# Patient Record
Sex: Male | Born: 1970 | Race: Black or African American | Hispanic: No | Marital: Single | Smoking: Current some day smoker
Health system: Southern US, Community
[De-identification: ages and names within clinical notes are randomized; demographics above are authoritative.]

## PROBLEM LIST (undated history)

## (undated) DIAGNOSIS — R269 Unspecified abnormalities of gait and mobility: Secondary | ICD-10-CM

## (undated) DIAGNOSIS — A530 Latent syphilis, unspecified as early or late: Secondary | ICD-10-CM

## (undated) DIAGNOSIS — G35 Multiple sclerosis: Secondary | ICD-10-CM

## (undated) DIAGNOSIS — M25559 Pain in unspecified hip: Secondary | ICD-10-CM

## (undated) DIAGNOSIS — C189 Malignant neoplasm of colon, unspecified: Secondary | ICD-10-CM

## (undated) DIAGNOSIS — G959 Disease of spinal cord, unspecified: Secondary | ICD-10-CM

## (undated) HISTORY — DX: Disease of spinal cord, unspecified: G95.9

## (undated) HISTORY — PX: CHOLECYSTECTOMY: SHX55

## (undated) HISTORY — DX: Latent syphilis, unspecified as early or late: A53.0

## (undated) HISTORY — DX: Malignant neoplasm of colon, unspecified: C18.9

## (undated) HISTORY — DX: Multiple sclerosis: G35

## (undated) HISTORY — PX: OTHER SURGICAL HISTORY: SHX169

## (undated) HISTORY — PX: HIP SURGERY: SHX245

## (undated) HISTORY — DX: Pain in unspecified hip: M25.559

## (undated) HISTORY — DX: Unspecified abnormalities of gait and mobility: R26.9

---

## 2008-05-02 ENCOUNTER — Encounter: Admission: RE | Admit: 2008-05-02 | Discharge: 2008-05-02 | Payer: Self-pay | Admitting: Family Medicine

## 2008-05-13 ENCOUNTER — Encounter: Admission: RE | Admit: 2008-05-13 | Discharge: 2008-05-13 | Payer: Self-pay | Admitting: Orthopedic Surgery

## 2008-06-03 ENCOUNTER — Emergency Department (HOSPITAL_COMMUNITY): Admission: EM | Admit: 2008-06-03 | Discharge: 2008-06-03 | Payer: Self-pay | Admitting: Emergency Medicine

## 2008-07-05 ENCOUNTER — Ambulatory Visit (HOSPITAL_BASED_OUTPATIENT_CLINIC_OR_DEPARTMENT_OTHER): Admission: RE | Admit: 2008-07-05 | Discharge: 2008-07-05 | Payer: Self-pay | Admitting: Orthopedic Surgery

## 2008-10-26 ENCOUNTER — Encounter: Admission: RE | Admit: 2008-10-26 | Discharge: 2008-10-26 | Payer: Self-pay | Admitting: Neurology
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
## 2011-07-18 ENCOUNTER — Other Ambulatory Visit: Payer: Self-pay | Admitting: Neurology

## 2011-07-18 DIAGNOSIS — G35 Multiple sclerosis: Secondary | ICD-10-CM

## 2011-07-26 ENCOUNTER — Ambulatory Visit
Admission: RE | Admit: 2011-07-26 | Discharge: 2011-07-26 | Disposition: A | Discharge status: Home / Self Care | Payer: Medicare Other | Source: Ambulatory Visit | Attending: Neurology | Admitting: Neurology

## 2011-07-26 DIAGNOSIS — G35 Multiple sclerosis: Secondary | ICD-10-CM

## 2011-07-26 MED ORDER — GADOBENATE DIMEGLUMINE 529 MG/ML IV SOLN
20.0000 mL | Freq: Once | INTRAVENOUS | Status: AC | PRN
  Administered 2011-07-26: 20 mL via INTRAVENOUS

## 2011-09-26 LAB — POCT HEMOGLOBIN-HEMACUE: Hemoglobin: 16.7

## 2012-12-07 ENCOUNTER — Other Ambulatory Visit: Payer: Self-pay | Admitting: Neurology

## 2012-12-07 DIAGNOSIS — G35 Multiple sclerosis: Secondary | ICD-10-CM

## 2012-12-18 ENCOUNTER — Ambulatory Visit
Admission: RE | Admit: 2012-12-18 | Discharge: 2012-12-18 | Disposition: A | Discharge status: Home / Self Care | Payer: Medicare Other | Source: Ambulatory Visit | Attending: Neurology | Admitting: Neurology

## 2012-12-18 DIAGNOSIS — G35D Multiple sclerosis, unspecified: Secondary | ICD-10-CM

## 2012-12-18 DIAGNOSIS — G35 Multiple sclerosis: Secondary | ICD-10-CM

## 2012-12-18 MED ORDER — GADOBENATE DIMEGLUMINE 529 MG/ML IV SOLN
20.0000 mL | Freq: Once | INTRAVENOUS | Status: AC | PRN
  Administered 2012-12-18: 20 mL via INTRAVENOUS

## 2013-04-16 ENCOUNTER — Ambulatory Visit (INDEPENDENT_AMBULATORY_CARE_PROVIDER_SITE_OTHER): Payer: Medicare Other | Admitting: Neurology

## 2013-04-16 ENCOUNTER — Encounter: Payer: Self-pay | Admitting: Neurology

## 2013-04-16 VITALS — BP 120/77 | HR 83 | Temp 98.6°F | Ht 73.5 in | Wt 244.0 lb

## 2013-04-16 DIAGNOSIS — G35 Multiple sclerosis: Secondary | ICD-10-CM | POA: Insufficient documentation

## 2013-04-16 HISTORY — DX: Multiple sclerosis: G35

## 2013-04-16 MED ORDER — BACLOFEN 10 MG PO TABS: 10.0000 mg | ORAL_TABLET | Freq: Three times a day (TID) | ORAL | Status: DC

## 2013-04-16 NOTE — Patient Instructions (Addendum)
I think overall you are doing fairly well but I do want to suggest a few things today:  Remember to drink plenty of fluid, eat healthy meals and do not skip any meals. Try to eat protein with a every meal and eat a healthy snack such as fruit or nuts in between meals. Try to keep a regular sleep-wake schedule and try to exercise daily, particularly in the form of walking, 20-30 minutes a day, if you can.   Engage in social activities in your community and with your family and try to keep up with current events by reading the newspaper or watching the news.   As far as your medications are concerned, I would like to suggest no change at this time. I will arrange for a FU with one of my colleagues, who are more experts in MS management.    Please call us with any interim questions, concerns, problems, updates or refill requests.  Brett Canales is my clinical assistant and will answer any of your questions and relay your messages to me and also relay most of my messages to you.  Our phone number is [phone]. We also have an after hours call service for urgent matters and there is a physician on-call for urgent questions. For any emergencies you know to call 911 or go to the nearest emergency room.
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
## 2013-04-17 LAB — COMPREHENSIVE METABOLIC PANEL
AST: 33 IU/L (ref 0–40)
Albumin: 4.6 g/dL (ref 3.5–5.5)
Alkaline Phosphatase: 110 IU/L (ref 39–117)
BUN: 14 mg/dL (ref 6–24)
CO2: 28 mmol/L (ref 19–28)
Calcium: 10 mg/dL (ref 8.7–10.2)
GFR calc non Af Amer: 83 mL/min/{1.73_m2} (ref >59)
Globulin, Total: 2.8 g/dL (ref 1.5–4.5)
Glucose: 82 mg/dL (ref 65–99)
Total Bilirubin: 0.3 mg/dL (ref 0.0–1.2)
Total Protein: 7.4 g/dL (ref 6.0–8.5)

## 2013-04-17 LAB — CBC
HCT: 45.1 % (ref 37.5–51.0)
Hemoglobin: 15.3 g/dL (ref 12.6–17.7)
MCHC: 33.9 g/dL (ref 31.5–35.7)
Platelets: 222 10*3/uL (ref 155–379)
RDW: 13.1 % (ref 12.3–15.4)

## 2013-04-19 NOTE — Progress Notes (Signed)
Quick Note:  Please call and advise the patient that the recent labs we checked were within normal limits. We checked cell count, electrolytes, kidney function, liver function. Please remind patient to keep any upcoming appointments and call with any interim questions, concerns, problems or updates. He has an appointment with Dr. Terrace Arabia on 10/08/2013 at 2:15 PM, Thanks,  Huston Foley, MD, PhD   ______

## 2013-04-23 NOTE — Progress Notes (Signed)
Quick Note:  Spoke with patient and relayed results of blood work. Patient understood and had no questions.  ______ 

## 2013-06-02 ENCOUNTER — Other Ambulatory Visit: Payer: Self-pay

## 2013-06-02 MED ORDER — INTERFERON BETA-1A 30 MCG/0.5ML IM KIT: 30.0000 ug | PACK | INTRAMUSCULAR | Status: DC

## 2013-10-08 ENCOUNTER — Encounter: Payer: Self-pay | Admitting: Neurology

## 2013-10-08 ENCOUNTER — Encounter (INDEPENDENT_AMBULATORY_CARE_PROVIDER_SITE_OTHER): Payer: Self-pay

## 2013-10-08 ENCOUNTER — Ambulatory Visit (INDEPENDENT_AMBULATORY_CARE_PROVIDER_SITE_OTHER): Payer: Medicare Other | Admitting: Neurology

## 2013-10-08 VITALS — BP 128/85 | HR 94 | Ht 74.0 in | Wt 244.0 lb

## 2013-10-08 DIAGNOSIS — G35 Multiple sclerosis: Secondary | ICD-10-CM

## 2013-10-08 MED ORDER — BACLOFEN 10 MG PO TABS: 10.0000 mg | ORAL_TABLET | Freq: Three times a day (TID) | ORAL | Status: DC
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
## 2013-10-08 NOTE — Patient Instructions (Signed)
http://www.[place].gov/modules/showdocument.aspx?documentid=12263  Scat applications,  Home physical therapy.

## 2013-12-12 ENCOUNTER — Other Ambulatory Visit: Payer: Self-pay

## 2013-12-12 MED ORDER — INTERFERON BETA-1A 30 MCG/0.5ML IM KIT: 30.0000 ug | PACK | INTRAMUSCULAR | Status: DC

## 2014-01-13 ENCOUNTER — Other Ambulatory Visit: Payer: Self-pay

## 2014-01-13 MED ORDER — INTERFERON BETA-1A 30 MCG/0.5ML IM KIT
30.0000 ug | PACK | INTRAMUSCULAR | Status: DC
Start: 2014-01-13 — End: 2014-04-11

## 2014-04-11 ENCOUNTER — Other Ambulatory Visit: Payer: Self-pay | Admitting: Neurology

## 2014-04-11 ENCOUNTER — Telehealth: Payer: Self-pay | Admitting: Neurology

## 2014-04-11 MED ORDER — INTERFERON BETA-1A 30 MCG/0.5ML IM KIT: 30.0000 ug | PACK | INTRAMUSCULAR | Status: DC

## 2014-04-11 NOTE — Telephone Encounter (Signed)
This has already been taken care of.  The pharmacy sent refill request.  I called back, got no answer, no VM

## 2014-05-13 ENCOUNTER — Ambulatory Visit
Admission: RE | Admit: 2014-05-13 | Discharge: 2014-05-13 | Disposition: A | Discharge status: Home / Self Care | Payer: Medicare Other | Source: Ambulatory Visit | Attending: Neurology | Admitting: Neurology

## 2014-05-13 DIAGNOSIS — G35 Multiple sclerosis: Secondary | ICD-10-CM

## 2014-05-13 MED ORDER — GADOBENATE DIMEGLUMINE 529 MG/ML IV SOLN
20.0000 mL | Freq: Once | INTRAVENOUS | Status: AC | PRN
  Administered 2014-05-13: 20 mL via INTRAVENOUS

## 2014-05-25 ENCOUNTER — Telehealth: Payer: Self-pay | Admitting: Neurology

## 2014-05-25 NOTE — Telephone Encounter (Signed)
Will discuss MRI findings, RRMS treatment plans in his followup visit in June 10 2014

## 2014-06-10 ENCOUNTER — Encounter: Payer: Self-pay | Admitting: Neurology

## 2014-06-10 ENCOUNTER — Ambulatory Visit (INDEPENDENT_AMBULATORY_CARE_PROVIDER_SITE_OTHER): Payer: Medicare Other | Admitting: Neurology

## 2014-06-10 VITALS — BP 151/88 | HR 92 | Ht 74.0 in | Wt 247.0 lb

## 2014-06-10 DIAGNOSIS — G35 Multiple sclerosis: Secondary | ICD-10-CM

## 2014-06-10 MED ORDER — BACLOFEN 10 MG PO TABS: 20.0000 mg | ORAL_TABLET | Freq: Three times a day (TID) | ORAL | Status: DC
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
## 2014-06-11 LAB — CBC WITH DIFFERENTIAL
Basophils Absolute: 0 10*3/uL (ref 0.0–0.2)
Basos: 0 %
EOS: 3 %
Eosinophils Absolute: 0.2 10*3/uL (ref 0.0–0.4)
HCT: 44.6 % (ref 37.5–51.0)
Hemoglobin: 15.5 g/dL (ref 12.6–17.7)
IMMATURE GRANS (ABS): 0 10*3/uL (ref 0.0–0.1)
IMMATURE GRANULOCYTES: 0 %
Lymphocytes Absolute: 3.2 10*3/uL — ABNORMAL HIGH (ref 0.7–3.1)
Lymphs: 65 %
MCH: 33 pg (ref 26.6–33.0)
MCHC: 34.8 g/dL (ref 31.5–35.7)
MCV: 95 fL (ref 79–97)
MONOCYTES: 7 %
Monocytes Absolute: 0.3 10*3/uL (ref 0.1–0.9)
Neutrophils Absolute: 1.3 10*3/uL — ABNORMAL LOW (ref 1.4–7.0)
Neutrophils Relative %: 25 %
PLATELETS: 202 10*3/uL (ref 150–379)
RBC: 4.69 x10E6/uL (ref 4.14–5.80)
RDW: 13.3 % (ref 12.3–15.4)
WBC: 5 10*3/uL (ref 3.4–10.8)

## 2014-06-11 LAB — COMPREHENSIVE METABOLIC PANEL
ALBUMIN: 4.8 g/dL (ref 3.5–5.5)
ALT: 29 IU/L (ref 0–44)
AST: 31 IU/L (ref 0–40)
Albumin/Globulin Ratio: 2 (ref 1.1–2.5)
Alkaline Phosphatase: 105 IU/L (ref 39–117)
BUN/Creatinine Ratio: 10 (ref 9–20)
BUN: 11 mg/dL (ref 6–24)
CO2: 25 mmol/L (ref 18–29)
Calcium: 10 mg/dL (ref 8.7–10.2)
Chloride: 101 mmol/L (ref 97–108)
Creatinine, Ser: 1.06 mg/dL (ref 0.76–1.27)
GFR calc Af Amer: 97 mL/min/{1.73_m2} (ref >59)
GFR calc non Af Amer: 84 mL/min/{1.73_m2} (ref >59)
Globulin, Total: 2.4 g/dL (ref 1.5–4.5)
Glucose: 93 mg/dL (ref 65–99)
Potassium: 3.9 mmol/L (ref 3.5–5.2)
Sodium: 143 mmol/L (ref 134–144)
Total Bilirubin: 0.3 mg/dL (ref 0.0–1.2)
Total Protein: 7.2 g/dL (ref 6.0–8.5)

## 2014-06-11 LAB — THYROID PANEL WITH TSH
Free Thyroxine Index: 2.5 (ref 1.2–4.9)
T3 UPTAKE RATIO: 25 % (ref 24–39)
T4 TOTAL: 10 ug/dL (ref 4.5–12.0)
TSH: 2.64 u[IU]/mL (ref 0.450–4.500)

## 2014-06-13 NOTE — Progress Notes (Signed)
Quick Note:  Shared normal labs with patient ,verbalized understanding ______ 

## 2014-12-16 ENCOUNTER — Ambulatory Visit: Payer: Medicare Other | Admitting: Neurology

## 2014-12-26 ENCOUNTER — Other Ambulatory Visit: Payer: Self-pay

## 2014-12-26 MED ORDER — INTERFERON BETA-1A 30 MCG IM KIT: 30.0000 ug | PACK | INTRAMUSCULAR | Status: DC

## 2015-01-13 ENCOUNTER — Ambulatory Visit (INDEPENDENT_AMBULATORY_CARE_PROVIDER_SITE_OTHER): Payer: Medicare Other | Admitting: Neurology

## 2015-01-13 ENCOUNTER — Telehealth: Payer: Self-pay | Admitting: Neurology

## 2015-01-13 ENCOUNTER — Encounter: Payer: Self-pay | Admitting: Neurology

## 2015-01-13 VITALS — BP 125/90 | HR 96 | Ht 75.0 in | Wt 247.0 lb

## 2015-01-13 DIAGNOSIS — G35 Multiple sclerosis: Secondary | ICD-10-CM

## 2015-01-13 MED ORDER — AZITHROMYCIN 500 MG PO TABS: ORAL_TABLET | ORAL | Status: DC

## 2015-01-13 MED ORDER — BACLOFEN 10 MG PO TABS: 20.0000 mg | ORAL_TABLET | Freq: Three times a day (TID) | ORAL | Status: DC

## 2015-01-13 MED ORDER — INTERFERON BETA-1A 30 MCG IM KIT: 30.0000 ug | PACK | INTRAMUSCULAR | Status: DC

## 2015-01-13 NOTE — Telephone Encounter (Signed)
I called the pharmacy.  Spoke with Luna Kitchens, who was not able to assist me and transferred me to [facility], who was also unable to assist me.  I was then transferred to Maralyn Sago, who transferred me to [facility].  Verbally authorized them to dispense refills on [name].  They will process today and contact patient regarding shipment.
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
## 2015-01-13 NOTE — Telephone Encounter (Signed)
Shanda Bumps, Please check on his avonex, he only has one injection left

## 2015-01-16 ENCOUNTER — Other Ambulatory Visit: Payer: Self-pay | Admitting: Neurology

## 2015-01-16 ENCOUNTER — Telehealth: Payer: Self-pay | Admitting: Neurology

## 2015-01-16 MED ORDER — INTERFERON BETA-1A 30 MCG IM KIT: 30.0000 ug | PACK | INTRAMUSCULAR | Status: DC

## 2015-01-16 NOTE — Telephone Encounter (Signed)
Patient states that his RXs from Friday needed to be sent to the [facility] on [address] in [place].

## 2015-01-16 NOTE — Telephone Encounter (Signed)
Rx has been resent to [facility] per patient request.

## 2015-01-17 ENCOUNTER — Telehealth: Payer: Self-pay | Admitting: Neurology

## 2015-01-17 MED ORDER — AZITHROMYCIN 500 MG PO TABS: ORAL_TABLET | ORAL | Status: DC

## 2015-01-17 NOTE — Telephone Encounter (Signed)
ATB and Baclofen have been resent to [facility].  I called [facility] at [phone].  Spoke with Renea Ee.  She verified they do not fill any prescriptions.  All Rx's are triaged to the patient's contracted pharmacy.  She suggested I call [facility] at [phone].  Spoke with Joni Reining.  She checked the account and said they were only able to serve patient when he had no ins coverage.  Recommended I contact [facility].  I called them.  Spoke with Clinta.  She verified they have all info needed and are processing the request.  Stated nothing further is needed from [place] at this time.  I called the patient back.  He is aware.

## 2015-01-17 NOTE — Telephone Encounter (Signed)
Patient is calling about Rx [name]-pen because it  should be sent to [facility] but was sent to [facility].  Baclofen and Antibiotic was susposed to be sent to [facility] on [address] of [place].   Patient's insurance will not cover [name]-pen unless sent to [facility].  Please call.

## 2015-01-18 ENCOUNTER — Telehealth: Payer: Self-pay | Admitting: *Deleted

## 2015-01-18 NOTE — Telephone Encounter (Signed)
I called [facility].  Spoke with Dorene Grebe.  She said they are faxing over a form that will need to be completed and returned.  I will be happy to take care of this once we receive the form.  They are not able to complete anything via phone per patient's plan restrictions.

## 2015-01-18 NOTE — Telephone Encounter (Signed)
Pt called and is out of avonex.  Needs for Monday.   He called and they told him needs PA.  Thinks [facility]? specialty pharmacy.  I told him will forward to [place] [facility] and relay that needs med for Monday.   [phone] (requested a call back).

## 2015-01-19 ENCOUNTER — Telehealth: Payer: Self-pay | Admitting: *Deleted

## 2015-01-19 NOTE — Telephone Encounter (Signed)
Consuela calling about pt medication hx, relating to MS meds.  I relayed from Dr. Zannie Cove note that pt diagnosed with MS 2009 and is taking avonex and that is the only treatment that I see that he has been on.  She verbalized understanding.

## 2015-01-19 NOTE — Telephone Encounter (Signed)
All info has been submitted to ins, request is currently under review.  I called the patient back.  He is aware.

## 2015-01-23 ENCOUNTER — Telehealth: Payer: Self-pay

## 2015-01-23 NOTE — Telephone Encounter (Signed)
[employer] notified us they have approved the request for coverage on [name] effective 12/30/2014-12/30/2015 Ref Case # [id number]. They indicate the patient has been notified of this decision as well.

## 2015-01-24 ENCOUNTER — Other Ambulatory Visit: Payer: Self-pay

## 2015-01-24 MED ORDER — INTERFERON BETA-1A 30 MCG IM KIT: 30.0000 ug | PACK | INTRAMUSCULAR | Status: DC

## 2015-01-24 NOTE — Telephone Encounter (Signed)
Pharmacy requests 90 day Rx  

## 2015-02-14 ENCOUNTER — Telehealth: Payer: Self-pay | Admitting: Neurology

## 2015-02-14 NOTE — Telephone Encounter (Signed)
Patient stated he needs 90 day supply for Rx interferon beta-1a ([name]) 30 MCG/0.5ML injection sent to [facility].  Please call and advise.

## 2015-02-14 NOTE — Telephone Encounter (Signed)
This Rx was already sent to [facility] for a 90 day supply with 3 refills last month.  I called the pharmacy.  Spoke with [employer].  She said they spoke with the patient yesterday, and have already shipped this med for 90 days, with expected delivery today.  I called the patient back.  He is aware.

## 2015-03-15 ENCOUNTER — Telehealth: Payer: Self-pay | Admitting: Neurology

## 2015-03-15 NOTE — Telephone Encounter (Signed)
I left a message for the patient to return my call.

## 2015-03-15 NOTE — Telephone Encounter (Signed)
I spoke to the patient about the [employer] research trial. I introduced the trial to the patient, and the patient expressed interest. I will mailed the ICF today for the patient to review, and I will check with the patient in a week. °

## 2015-03-20 ENCOUNTER — Telehealth: Payer: Self-pay | Admitting: Neurology

## 2015-03-20 NOTE — Telephone Encounter (Signed)
I left a message for the patient to return my call.

## 2015-03-21 ENCOUNTER — Telehealth: Payer: Self-pay | Admitting: Neurology

## 2015-03-21 NOTE — Telephone Encounter (Signed)
Please let him contact his primary care for antibiotic refill

## 2015-03-21 NOTE — Telephone Encounter (Signed)
Patient requesting refill on Zithromax.  Last prescribed at [facility] on 01/13/15.  Would you like to refill?  Please advise.  Thank you.

## 2015-03-21 NOTE — Telephone Encounter (Signed)
I called the patient back.  Relayed providers message.  He verbalized understanding and was agreeable to this.

## 2015-03-21 NOTE — Telephone Encounter (Signed)
Patient requesting refill for Rx azithromycin (ZITHROMAX) 500 MG tablet.  Please forward to [facility].  Please call and advise.

## 2015-04-05 ENCOUNTER — Telehealth: Payer: Self-pay | Admitting: Neurology

## 2015-04-05 NOTE — Telephone Encounter (Signed)
Dominique with [facility] is calling to get a new Rx for patient for interferon beta-1a ([name]) 30 MCG injection for a 90 day supply. Thank you.

## 2015-04-05 NOTE — Telephone Encounter (Signed)
A 90 day Rx with 4 refills was sent to [facility] in Jan.  I called back.  Spoke with Inetta Fermo.  She verified they do have this Rx on file.  Nothing further is needed.

## 2015-04-13 ENCOUNTER — Telehealth: Payer: Self-pay | Admitting: Neurology

## 2015-04-13 NOTE — Telephone Encounter (Signed)
I left a message for the patient to return my call.

## 2015-04-14 ENCOUNTER — Telehealth: Payer: Self-pay | Admitting: Neurology

## 2015-04-14 NOTE — Telephone Encounter (Signed)
I left a message for the patient to return my call.

## 2015-07-27 ENCOUNTER — Ambulatory Visit: Payer: Medicare Other | Admitting: Neurology

## 2015-08-10 ENCOUNTER — Ambulatory Visit: Payer: Self-pay | Admitting: Neurology

## 2015-08-10 ENCOUNTER — Encounter: Payer: Self-pay | Admitting: *Deleted

## 2015-08-10 ENCOUNTER — Ambulatory Visit (INDEPENDENT_AMBULATORY_CARE_PROVIDER_SITE_OTHER): Payer: Medicare Other | Admitting: Neurology

## 2015-08-10 DIAGNOSIS — R252 Cramp and spasm: Secondary | ICD-10-CM

## 2015-08-10 DIAGNOSIS — G35 Multiple sclerosis: Secondary | ICD-10-CM | POA: Diagnosis not present

## 2015-08-10 DIAGNOSIS — R269 Unspecified abnormalities of gait and mobility: Secondary | ICD-10-CM

## 2015-08-10 DIAGNOSIS — R258 Other abnormal involuntary movements: Secondary | ICD-10-CM | POA: Diagnosis not present
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
## 2015-08-10 NOTE — Patient Instructions (Signed)
[email or web address]

## 2015-08-11 LAB — CBC
HEMATOCRIT: 43.4 % (ref 37.5–51.0)
HEMOGLOBIN: 14.8 g/dL (ref 12.6–17.7)
MCH: 32.5 pg (ref 26.6–33.0)
MCHC: 34.1 g/dL (ref 31.5–35.7)
MCV: 95 fL (ref 79–97)
Platelets: 183 10*3/uL (ref 150–379)
RBC: 4.56 x10E6/uL (ref 4.14–5.80)
RDW: 13.1 % (ref 12.3–15.4)
WBC: 4.5 10*3/uL (ref 3.4–10.8)

## 2015-08-11 LAB — COMPREHENSIVE METABOLIC PANEL
A/G RATIO: 1.5 (ref 1.1–2.5)
ALK PHOS: 119 IU/L — AB (ref 39–117)
ALT: 32 IU/L (ref 0–44)
AST: 29 IU/L (ref 0–40)
Albumin: 4.3 g/dL (ref 3.5–5.5)
BUN/Creatinine Ratio: 10 (ref 9–20)
BUN: 12 mg/dL (ref 6–24)
Bilirubin Total: 0.3 mg/dL (ref 0.0–1.2)
CALCIUM: 9.6 mg/dL (ref 8.7–10.2)
CHLORIDE: 103 mmol/L (ref 97–108)
CO2: 26 mmol/L (ref 18–29)
Creatinine, Ser: 1.18 mg/dL (ref 0.76–1.27)
GFR calc Af Amer: 85 mL/min/{1.73_m2} (ref >59)
GFR calc non Af Amer: 74 mL/min/{1.73_m2} (ref >59)
Globulin, Total: 2.8 g/dL (ref 1.5–4.5)
Glucose: 93 mg/dL (ref 65–99)
POTASSIUM: 4.2 mmol/L (ref 3.5–5.2)
SODIUM: 143 mmol/L (ref 134–144)
Total Protein: 7.1 g/dL (ref 6.0–8.5)

## 2015-08-11 LAB — VARICELLA ZOSTER ANTIBODY, IGG: VARICELLA: 1873 {index} (ref >165)

## 2015-08-11 LAB — TSH: TSH: 2.93 u[IU]/mL (ref 0.450–4.500)

## 2015-08-14 ENCOUNTER — Telehealth: Payer: Self-pay | Admitting: *Deleted

## 2015-08-14 NOTE — Telephone Encounter (Signed)
Spoke to patient notified of results.

## 2015-08-14 NOTE — Telephone Encounter (Signed)
-----   Message from Levert Feinstein, MD sent at 08/14/2015  8:21 AM EDT ----- Please call patient for no significant abnormalities on labs.

## 2015-08-15 ENCOUNTER — Telehealth: Payer: Self-pay | Admitting: *Deleted

## 2015-08-15 NOTE — Telephone Encounter (Signed)
Labs collected 08/10/15: JCV positive 2.72

## 2015-08-17 ENCOUNTER — Encounter: Payer: Self-pay | Admitting: Neurology

## 2015-08-30 ENCOUNTER — Other Ambulatory Visit: Payer: Medicare Other

## 2015-08-31 ENCOUNTER — Other Ambulatory Visit: Payer: Medicare Other

## 2015-09-06 ENCOUNTER — Ambulatory Visit
Admission: RE | Admit: 2015-09-06 | Discharge: 2015-09-06 | Disposition: A | Discharge status: Home / Self Care | Payer: Medicare Other | Source: Ambulatory Visit | Attending: Neurology | Admitting: Neurology

## 2015-09-06 DIAGNOSIS — G35 Multiple sclerosis: Secondary | ICD-10-CM | POA: Diagnosis not present

## 2015-09-06 DIAGNOSIS — R252 Cramp and spasm: Secondary | ICD-10-CM

## 2015-09-06 DIAGNOSIS — R269 Unspecified abnormalities of gait and mobility: Secondary | ICD-10-CM

## 2015-09-06 MED ORDER — GADOBENATE DIMEGLUMINE 529 MG/ML IV SOLN
20.0000 mL | Freq: Once | INTRAVENOUS | Status: AC | PRN
  Administered 2015-09-06: 20 mL via INTRAVENOUS

## 2015-09-08 ENCOUNTER — Telehealth: Payer: Self-pay | Admitting: Neurology

## 2015-09-08 NOTE — Telephone Encounter (Signed)
Please call patient, MRI of the brain and cervical spine showed stable MS lesions, will review in detail at his follow-up visit

## 2015-09-08 NOTE — Telephone Encounter (Signed)
Patient aware of results and will keep his follow up appt on 09/14/15.

## 2015-09-14 ENCOUNTER — Encounter: Payer: Self-pay | Admitting: Neurology

## 2015-09-14 ENCOUNTER — Ambulatory Visit (INDEPENDENT_AMBULATORY_CARE_PROVIDER_SITE_OTHER): Payer: Medicare Other | Admitting: Neurology

## 2015-09-14 VITALS — BP 135/98 | HR 101 | Ht 75.0 in | Wt 218.5 lb

## 2015-09-14 DIAGNOSIS — R258 Other abnormal involuntary movements: Secondary | ICD-10-CM | POA: Diagnosis not present

## 2015-09-14 DIAGNOSIS — G35 Multiple sclerosis: Secondary | ICD-10-CM | POA: Diagnosis not present

## 2015-09-14 DIAGNOSIS — R269 Unspecified abnormalities of gait and mobility: Secondary | ICD-10-CM

## 2015-09-14 DIAGNOSIS — R252 Cramp and spasm: Secondary | ICD-10-CM
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
## 2015-10-12 ENCOUNTER — Other Ambulatory Visit: Payer: Self-pay

## 2015-10-12 MED ORDER — INTERFERON BETA-1A 30 MCG/0.5ML IM AJKT
30.0000 ug | AUTO-INJECTOR | INTRAMUSCULAR | Status: DC
Start: 2015-10-12 — End: 2016-09-10

## 2015-12-28 ENCOUNTER — Telehealth: Payer: Self-pay | Admitting: Neurology

## 2015-12-28 NOTE — Telephone Encounter (Signed)
Thank you for this update.   Ins has been contacted and provided with clinical info, asking for continuation of coverage.  Request is under review Ref # [id number]  Monia Pouch has approved the request for continuation of coverage on [name] effective until 12/29/2016 Ref # [id number] Ref ID# [id number]

## 2015-12-28 NOTE — Telephone Encounter (Signed)
Shawn Kelly [phone] with [facility] called with courtesy call to let provider know [name] will be expiring 12/30/15. Will need prior auth for 2017. Also Rebif is [employer] [facility] preferred MS injectible but she is not asking provider to change anything. Just courtesy information.

## 2016-01-10 ENCOUNTER — Telehealth: Payer: Self-pay | Admitting: Neurology

## 2016-01-10 ENCOUNTER — Other Ambulatory Visit: Payer: Self-pay | Admitting: Neurology

## 2016-01-10 NOTE — Telephone Encounter (Signed)
Pt called requesting refill for baclofen (LIORESAL) 10 MG tablet . He has enough for 3 more days.

## 2016-01-26 DIAGNOSIS — G35 Multiple sclerosis: Secondary | ICD-10-CM | POA: Diagnosis not present

## 2016-01-26 DIAGNOSIS — F17210 Nicotine dependence, cigarettes, uncomplicated: Secondary | ICD-10-CM | POA: Diagnosis not present

## 2016-01-26 DIAGNOSIS — Z9889 Other specified postprocedural states: Secondary | ICD-10-CM | POA: Diagnosis not present

## 2016-03-14 ENCOUNTER — Encounter: Payer: Self-pay | Admitting: Nurse Practitioner

## 2016-03-14 ENCOUNTER — Ambulatory Visit (INDEPENDENT_AMBULATORY_CARE_PROVIDER_SITE_OTHER): Payer: Medicare Other | Admitting: Nurse Practitioner

## 2016-03-14 VITALS — BP 140/82 | HR 86 | Ht 75.0 in | Wt 225.2 lb

## 2016-03-14 DIAGNOSIS — G35 Multiple sclerosis: Secondary | ICD-10-CM

## 2016-03-14 DIAGNOSIS — R269 Unspecified abnormalities of gait and mobility: Secondary | ICD-10-CM | POA: Insufficient documentation

## 2016-03-14 DIAGNOSIS — Z5181 Encounter for therapeutic drug level monitoring: Secondary | ICD-10-CM
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
## 2016-03-14 NOTE — Patient Instructions (Addendum)
Continue [name] weekly Check labs today Continue baclofen for spasticity  Follow-up in 6 months

## 2016-03-15 ENCOUNTER — Telehealth: Payer: Self-pay | Admitting: *Deleted

## 2016-03-15 LAB — CBC WITH DIFFERENTIAL/PLATELET
BASOS: 0 %
Basophils Absolute: 0 10*3/uL (ref 0.0–0.2)
EOS (ABSOLUTE): 0.2 10*3/uL (ref 0.0–0.4)
Eos: 4 %
Hematocrit: 43.8 % (ref 37.5–51.0)
Hemoglobin: 15.1 g/dL (ref 12.6–17.7)
IMMATURE GRANS (ABS): 0 10*3/uL (ref 0.0–0.1)
IMMATURE GRANULOCYTES: 0 %
LYMPHS: 68 %
Lymphocytes Absolute: 3.5 10*3/uL — ABNORMAL HIGH (ref 0.7–3.1)
MCH: 32.7 pg (ref 26.6–33.0)
MCHC: 34.5 g/dL (ref 31.5–35.7)
MCV: 95 fL (ref 79–97)
Monocytes Absolute: 0.4 10*3/uL (ref 0.1–0.9)
Monocytes: 7 %
NEUTROS PCT: 21 %
Neutrophils Absolute: 1.1 10*3/uL — ABNORMAL LOW (ref 1.4–7.0)
PLATELETS: 203 10*3/uL (ref 150–379)
RBC: 4.62 x10E6/uL (ref 4.14–5.80)
RDW: 13.2 % (ref 12.3–15.4)
WBC: 5.3 10*3/uL (ref 3.4–10.8)

## 2016-03-15 LAB — COMPREHENSIVE METABOLIC PANEL
A/G RATIO: 1.4 (ref 1.2–2.2)
ALT: 31 IU/L (ref 0–44)
AST: 35 IU/L (ref 0–40)
Albumin: 4.3 g/dL (ref 3.5–5.5)
Alkaline Phosphatase: 112 IU/L (ref 39–117)
BUN/Creatinine Ratio: 10 (ref 9–20)
BUN: 11 mg/dL (ref 6–24)
Bilirubin Total: 0.4 mg/dL (ref 0.0–1.2)
CALCIUM: 9.2 mg/dL (ref 8.7–10.2)
CO2: 26 mmol/L (ref 18–29)
CREATININE: 1.07 mg/dL (ref 0.76–1.27)
Chloride: 102 mmol/L (ref 96–106)
GFR calc Af Amer: 96 mL/min/{1.73_m2} (ref >59)
GFR, EST NON AFRICAN AMERICAN: 83 mL/min/{1.73_m2} (ref >59)
Globulin, Total: 3 g/dL (ref 1.5–4.5)
Glucose: 93 mg/dL (ref 65–99)
POTASSIUM: 4 mmol/L (ref 3.5–5.2)
Sodium: 141 mmol/L (ref 134–144)
Total Protein: 7.3 g/dL (ref 6.0–8.5)

## 2016-03-15 NOTE — Telephone Encounter (Signed)
-----   Message from Nilda Riggs, NP sent at 03/15/2016  9:47 AM EDT ----- Labs look good please call the patient

## 2016-03-15 NOTE — Telephone Encounter (Signed)
I spoke to pt and relayed that labs looked good.   He verbalized understanding.

## 2016-04-11 ENCOUNTER — Other Ambulatory Visit: Payer: Self-pay | Admitting: Neurology

## 2016-04-11 ENCOUNTER — Telehealth: Payer: Self-pay | Admitting: Neurology

## 2016-04-11 NOTE — Telephone Encounter (Signed)
Ok to renew?  

## 2016-04-11 NOTE — Telephone Encounter (Signed)
Pt is requesting refill on baclofen (LIORESAL) 10 MG tablet

## 2016-08-05 DIAGNOSIS — Z9889 Other specified postprocedural states: Secondary | ICD-10-CM | POA: Diagnosis not present

## 2016-08-05 DIAGNOSIS — G35 Multiple sclerosis: Secondary | ICD-10-CM | POA: Diagnosis not present

## 2016-08-05 DIAGNOSIS — F17200 Nicotine dependence, unspecified, uncomplicated: Secondary | ICD-10-CM | POA: Diagnosis not present

## 2016-09-10 ENCOUNTER — Other Ambulatory Visit: Payer: Self-pay | Admitting: Neurology

## 2016-09-18 ENCOUNTER — Encounter: Payer: Self-pay | Admitting: Nurse Practitioner

## 2016-09-18 ENCOUNTER — Ambulatory Visit (INDEPENDENT_AMBULATORY_CARE_PROVIDER_SITE_OTHER): Payer: Medicare Other | Admitting: Nurse Practitioner

## 2016-09-18 VITALS — BP 140/98 | HR 79 | Ht 75.0 in | Wt 221.4 lb

## 2016-09-18 DIAGNOSIS — G35 Multiple sclerosis: Secondary | ICD-10-CM

## 2016-09-18 DIAGNOSIS — R269 Unspecified abnormalities of gait and mobility: Secondary | ICD-10-CM | POA: Diagnosis not present

## 2016-09-18 DIAGNOSIS — Z5181 Encounter for therapeutic drug level monitoring: Secondary | ICD-10-CM | POA: Diagnosis not present

## 2016-09-18 MED ORDER — BACLOFEN 10 MG PO TABS: ORAL_TABLET | ORAL | 1 refills | Status: DC

## 2016-09-18 NOTE — Patient Instructions (Addendum)
Continue [name] weekly Check labs today Baclofen 20 mg 3 times a day and 10 mg at 10 PM Follow-up in 6 months next visit with Dr. Manus Rudd
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
## 2016-09-19 ENCOUNTER — Telehealth: Payer: Self-pay | Admitting: Nurse Practitioner

## 2016-09-19 LAB — COMPREHENSIVE METABOLIC PANEL
A/G RATIO: 1.5 (ref 1.2–2.2)
ALT: 33 IU/L (ref 0–44)
AST: 30 IU/L (ref 0–40)
Albumin: 4.3 g/dL (ref 3.5–5.5)
Alkaline Phosphatase: 108 IU/L (ref 39–117)
BILIRUBIN TOTAL: 0.4 mg/dL (ref 0.0–1.2)
BUN/Creatinine Ratio: 10 (ref 9–20)
BUN: 12 mg/dL (ref 6–24)
CALCIUM: 9.4 mg/dL (ref 8.7–10.2)
CHLORIDE: 102 mmol/L (ref 96–106)
CO2: 27 mmol/L (ref 18–29)
Creatinine, Ser: 1.15 mg/dL (ref 0.76–1.27)
GFR, EST AFRICAN AMERICAN: 87 mL/min/{1.73_m2} (ref >59)
GFR, EST NON AFRICAN AMERICAN: 75 mL/min/{1.73_m2} (ref >59)
GLOBULIN, TOTAL: 2.9 g/dL (ref 1.5–4.5)
Glucose: 89 mg/dL (ref 65–99)
Potassium: 3.9 mmol/L (ref 3.5–5.2)
SODIUM: 142 mmol/L (ref 134–144)
TOTAL PROTEIN: 7.2 g/dL (ref 6.0–8.5)

## 2016-09-19 LAB — CBC WITH DIFFERENTIAL/PLATELET
BASOS ABS: 0 10*3/uL (ref 0.0–0.2)
Basos: 1 %
EOS (ABSOLUTE): 0.2 10*3/uL (ref 0.0–0.4)
Eos: 5 %
HEMOGLOBIN: 14.6 g/dL (ref 12.6–17.7)
Hematocrit: 42.3 % (ref 37.5–51.0)
IMMATURE GRANS (ABS): 0 10*3/uL (ref 0.0–0.1)
Immature Granulocytes: 0 %
LYMPHS: 63 %
Lymphocytes Absolute: 3.2 10*3/uL — ABNORMAL HIGH (ref 0.7–3.1)
MCH: 32.1 pg (ref 26.6–33.0)
MCHC: 34.5 g/dL (ref 31.5–35.7)
MCV: 93 fL (ref 79–97)
MONOCYTES: 8 %
Monocytes Absolute: 0.4 10*3/uL (ref 0.1–0.9)
Neutrophils Absolute: 1.2 10*3/uL — ABNORMAL LOW (ref 1.4–7.0)
Neutrophils: 23 %
Platelets: 192 10*3/uL (ref 150–379)
RBC: 4.55 x10E6/uL (ref 4.14–5.80)
RDW: 13.8 % (ref 12.3–15.4)
WBC: 5 10*3/uL (ref 3.4–10.8)

## 2016-09-19 MED ORDER — BACLOFEN 10 MG PO TABS: ORAL_TABLET | ORAL | 1 refills | Status: DC

## 2016-09-19 NOTE — Telephone Encounter (Signed)
Spoke to pt and he usually gets at [facility], 90 day supply.  Will send to [facility].   Done.  He had received call from [facility] and they will not send.

## 2016-09-19 NOTE — Telephone Encounter (Signed)
Pt called in stating his baclofen (LIORESAL) 10 MG tablet was sent to the wrong pharmacy.  Please send to [id number] - [place], [place] - [address]

## 2016-09-19 NOTE — Progress Notes (Signed)
I have reviewed and agreed above plan. 

## 2016-09-20 ENCOUNTER — Telehealth: Payer: Self-pay | Admitting: *Deleted

## 2016-09-20 ENCOUNTER — Encounter: Payer: Self-pay | Admitting: *Deleted

## 2016-09-20 NOTE — Telephone Encounter (Signed)
Spoke to pt and relayed that labs stable.  He verbalized understanding.

## 2016-09-20 NOTE — Telephone Encounter (Signed)
Error   This encounter was created in error - please disregard. 

## 2016-09-20 NOTE — Telephone Encounter (Signed)
-----   Message from Nilda Riggs, NP sent at 09/19/2016  8:24 AM EDT ----- Labs stable please call the patient

## 2016-10-02 ENCOUNTER — Ambulatory Visit
Admission: RE | Admit: 2016-10-02 | Discharge: 2016-10-02 | Disposition: A | Discharge status: Home / Self Care | Payer: Medicare Other | Source: Ambulatory Visit | Attending: Nurse Practitioner | Admitting: Nurse Practitioner

## 2016-10-02 DIAGNOSIS — G35 Multiple sclerosis: Secondary | ICD-10-CM | POA: Diagnosis not present

## 2016-10-02 DIAGNOSIS — R269 Unspecified abnormalities of gait and mobility: Secondary | ICD-10-CM

## 2016-10-02 MED ORDER — GADOBENATE DIMEGLUMINE 529 MG/ML IV SOLN
20.0000 mL | Freq: Once | INTRAVENOUS | Status: AC | PRN
  Administered 2016-10-02: 20 mL via INTRAVENOUS

## 2016-10-04 ENCOUNTER — Telehealth: Payer: Self-pay | Admitting: *Deleted

## 2016-10-04 NOTE — Telephone Encounter (Signed)
Spoke to pt and relayed that his MRI C spine did not show active plaques.  No significant changes since previous one done 09-06-2015.  He verbalized understanding.

## 2016-10-04 NOTE — Telephone Encounter (Signed)
-----   Message from Nilda Riggs, NP sent at 10/04/2016  8:06 AM EDT ----- MRI of cervical spine with no acute plaques, and no significant change since previous on 09/06/2015 Please call

## 2016-12-09 ENCOUNTER — Telehealth: Payer: Self-pay | Admitting: Neurology

## 2016-12-09 NOTE — Telephone Encounter (Signed)
Returned call to [employer] ([phone])  - PA approved for [name] - valid through 12/29/17 - pt ID#[id number].

## 2016-12-09 NOTE — Telephone Encounter (Signed)
[employer] called stating that a PA was require for rx [name]. Please call [phone] to obtain.

## 2016-12-19 DIAGNOSIS — Z0271 Encounter for disability determination: Secondary | ICD-10-CM

## 2017-01-30 DIAGNOSIS — Z9889 Other specified postprocedural states: Secondary | ICD-10-CM | POA: Diagnosis not present

## 2017-01-30 DIAGNOSIS — I10 Essential (primary) hypertension: Secondary | ICD-10-CM | POA: Diagnosis not present

## 2017-01-30 DIAGNOSIS — F17200 Nicotine dependence, unspecified, uncomplicated: Secondary | ICD-10-CM | POA: Diagnosis not present

## 2017-01-30 DIAGNOSIS — G35 Multiple sclerosis: Secondary | ICD-10-CM | POA: Diagnosis not present

## 2017-03-19 ENCOUNTER — Telehealth: Payer: Self-pay | Admitting: *Deleted

## 2017-03-19 ENCOUNTER — Ambulatory Visit: Payer: Medicare Other | Admitting: Neurology

## 2017-03-19 NOTE — Telephone Encounter (Signed)
Canceled follow up same day due to weather.

## 2017-04-02 ENCOUNTER — Telehealth: Payer: Self-pay | Admitting: Nurse Practitioner

## 2017-04-02 ENCOUNTER — Other Ambulatory Visit: Payer: Self-pay | Admitting: Nurse Practitioner

## 2017-04-02 NOTE — Telephone Encounter (Signed)
Pt request refill for baclofen (LIORESAL) 10 MG tablet sent to [facility]. Pt has enough for tomorrow.

## 2017-04-02 NOTE — Telephone Encounter (Signed)
Medication has already been refilled today electronically.

## 2017-05-15 ENCOUNTER — Encounter: Payer: Self-pay | Admitting: Neurology

## 2017-05-15 ENCOUNTER — Encounter: Payer: Self-pay | Admitting: *Deleted

## 2017-05-15 ENCOUNTER — Ambulatory Visit (INDEPENDENT_AMBULATORY_CARE_PROVIDER_SITE_OTHER): Payer: Medicare HMO | Admitting: Neurology

## 2017-05-15 ENCOUNTER — Encounter (INDEPENDENT_AMBULATORY_CARE_PROVIDER_SITE_OTHER): Payer: Self-pay

## 2017-05-15 VITALS — BP 129/92 | HR 91 | Ht 75.0 in | Wt 229.5 lb

## 2017-05-15 DIAGNOSIS — G35 Multiple sclerosis: Secondary | ICD-10-CM

## 2017-05-15 DIAGNOSIS — R269 Unspecified abnormalities of gait and mobility: Secondary | ICD-10-CM | POA: Diagnosis not present

## 2017-05-15 MED ORDER — BACLOFEN 10 MG PO TABS: ORAL_TABLET | ORAL | 4 refills | Status: DC

## 2017-05-15 MED ORDER — AZITHROMYCIN 1 G PO PACK: 1.0000 g | PACK | Freq: Once | ORAL | 0 refills | Status: AC
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
## 2017-08-05 ENCOUNTER — Other Ambulatory Visit: Payer: Self-pay | Admitting: Neurology

## 2017-09-29 ENCOUNTER — Other Ambulatory Visit: Payer: Self-pay | Admitting: *Deleted

## 2017-09-29 DIAGNOSIS — G35 Multiple sclerosis: Secondary | ICD-10-CM

## 2017-10-06 ENCOUNTER — Telehealth: Payer: Self-pay | Admitting: Neurology

## 2017-10-06 NOTE — Telephone Encounter (Signed)
Patient called office in requesting refill for baclofen (LIORESAL) 10 MG tablet.  Pharmacy-  [facility] ( [address] [place]).  Patient will be out of the medication in 2 days.

## 2017-10-06 NOTE — Telephone Encounter (Addendum)
Last baclofen refill provided for 90-days on 05/12/17 w/ 4 additional refills.  Returned call to patient - he will contact is pharmacy to get his prescription filled.

## 2017-10-22 ENCOUNTER — Ambulatory Visit
Admission: RE | Admit: 2017-10-22 | Discharge: 2017-10-22 | Disposition: A | Discharge status: Home / Self Care | Payer: Medicare Other | Source: Ambulatory Visit | Attending: Neurology | Admitting: Neurology

## 2017-10-22 DIAGNOSIS — G35 Multiple sclerosis: Secondary | ICD-10-CM | POA: Diagnosis not present

## 2017-10-22 DIAGNOSIS — R269 Unspecified abnormalities of gait and mobility: Secondary | ICD-10-CM

## 2017-10-22 MED ORDER — GADOBENATE DIMEGLUMINE 529 MG/ML IV SOLN
20.0000 mL | Freq: Once | INTRAVENOUS | Status: AC | PRN
  Administered 2017-10-22: 20 mL via INTRAVENOUS

## 2017-10-27 ENCOUNTER — Telehealth: Payer: Self-pay | Admitting: Neurology

## 2017-10-27 NOTE — Telephone Encounter (Signed)
Left patient a detailed message, with results, on voicemail (ok per DPR).  Provided our number to call back with any questions.  

## 2017-10-27 NOTE — Telephone Encounter (Addendum)
MRI brain and cervical showed evidence of chronic demyelinating plaques, no acute abnormalites, no change compared to previous scan in 2016 and 2017  IMPRESSION:  Abnormal MRI brain (with and without) demonstrating: 1. Few periventricular, subcortical and juxtacortical chronic demyelinating plaques.   2. No acute plaques. 3. Compared to MRI on 09/06/15, no significant change.  IMPRESSION:  Abnormal MRI cervical spine (with and without) demonstrating: 1. Multiple chronic demyelinating plaques from C2 down to T2 levels.   2. No acute plaques. 3. No significant change from MRI on 10/02/16.

## 2017-11-06 ENCOUNTER — Ambulatory Visit: Payer: Self-pay | Admitting: Neurology
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
## 2017-12-24 ENCOUNTER — Encounter: Payer: Self-pay | Admitting: Nurse Practitioner

## 2017-12-24 ENCOUNTER — Ambulatory Visit: Payer: Medicare HMO | Admitting: Nurse Practitioner

## 2017-12-24 VITALS — BP 150/95 | HR 60 | Ht 75.0 in | Wt 230.2 lb

## 2017-12-24 DIAGNOSIS — R269 Unspecified abnormalities of gait and mobility: Secondary | ICD-10-CM

## 2017-12-24 DIAGNOSIS — G35 Multiple sclerosis: Secondary | ICD-10-CM | POA: Diagnosis not present

## 2017-12-24 DIAGNOSIS — Z5181 Encounter for therapeutic drug level monitoring: Secondary | ICD-10-CM | POA: Insufficient documentation

## 2017-12-24 MED ORDER — INTERFERON BETA-1A 30 MCG/0.5ML IM AJKT: 30.0000 ug | AUTO-INJECTOR | INTRAMUSCULAR | 6 refills | Status: DC

## 2017-12-24 NOTE — Patient Instructions (Signed)
Continue [name] weekly We will check labs today to monitor adverse effects of [name] Continue to use crutches for safe ambulation Follow-up in 6 months

## 2017-12-25 ENCOUNTER — Telehealth: Payer: Self-pay | Admitting: *Deleted

## 2017-12-25 LAB — CBC WITH DIFFERENTIAL/PLATELET
BASOS: 1 %
Basophils Absolute: 0.1 10*3/uL (ref 0.0–0.2)
EOS (ABSOLUTE): 0.2 10*3/uL (ref 0.0–0.4)
EOS: 4 %
HEMOGLOBIN: 14.8 g/dL (ref 13.0–17.7)
Hematocrit: 42.4 % (ref 37.5–51.0)
Immature Grans (Abs): 0 10*3/uL (ref 0.0–0.1)
Immature Granulocytes: 0 %
LYMPHS ABS: 4 10*3/uL — AB (ref 0.7–3.1)
Lymphs: 70 %
MCH: 32.7 pg (ref 26.6–33.0)
MCHC: 34.9 g/dL (ref 31.5–35.7)
MCV: 94 fL (ref 79–97)
Monocytes Absolute: 0.5 10*3/uL (ref 0.1–0.9)
Monocytes: 9 %
NEUTROS ABS: 0.9 10*3/uL — AB (ref 1.4–7.0)
Neutrophils: 16 %
Platelets: 200 10*3/uL (ref 150–379)
RBC: 4.52 x10E6/uL (ref 4.14–5.80)
RDW: 13.8 % (ref 12.3–15.4)
WBC: 5.6 10*3/uL (ref 3.4–10.8)

## 2017-12-25 LAB — COMPREHENSIVE METABOLIC PANEL
ALBUMIN: 4.4 g/dL (ref 3.5–5.5)
ALT: 31 IU/L (ref 0–44)
AST: 42 IU/L — ABNORMAL HIGH (ref 0–40)
Albumin/Globulin Ratio: 1.6 (ref 1.2–2.2)
Alkaline Phosphatase: 114 IU/L (ref 39–117)
BILIRUBIN TOTAL: 0.3 mg/dL (ref 0.0–1.2)
BUN / CREAT RATIO: 13 (ref 9–20)
BUN: 14 mg/dL (ref 6–24)
CO2: 23 mmol/L (ref 20–29)
CREATININE: 1.06 mg/dL (ref 0.76–1.27)
Calcium: 9.5 mg/dL (ref 8.7–10.2)
Chloride: 105 mmol/L (ref 96–106)
GFR, EST AFRICAN AMERICAN: 95 mL/min/{1.73_m2} (ref >59)
GFR, EST NON AFRICAN AMERICAN: 83 mL/min/{1.73_m2} (ref >59)
GLUCOSE: 90 mg/dL (ref 65–99)
Globulin, Total: 2.8 g/dL (ref 1.5–4.5)
Potassium: 4.1 mmol/L (ref 3.5–5.2)
Sodium: 143 mmol/L (ref 134–144)
TOTAL PROTEIN: 7.2 g/dL (ref 6.0–8.5)

## 2017-12-25 NOTE — Telephone Encounter (Signed)
Called patient and informed him that his labs are stable. He verbalized understanding, appreciation.

## 2017-12-31 ENCOUNTER — Telehealth: Payer: Self-pay | Admitting: *Deleted

## 2017-12-31 NOTE — Progress Notes (Signed)
I have reviewed and agreed above plan. 

## 2017-12-31 NOTE — Telephone Encounter (Signed)
Received call from Jola Schmidt tech, [facility] questioning refills on [name]. This RN advised that NP most likely meant for the disp #4 to be refilled x 6. This would be total of 7 months of medication.  Olena Leatherwood stated she would discuss with pharmacist and call back next week when NP returns if needed.

## 2018-01-02 NOTE — Telephone Encounter (Signed)
Ruth/[facility] [phone] needs clarification if med can be for 3 mth refill. Please call to advise

## 2018-01-05 NOTE — Telephone Encounter (Signed)
Called [facility] , spoke with Alinda Money pharmacist . He stated most recent Rx gave a one month supply, however patient has been receiving [name] 3 month supply at a time. This RN gave verbal to change Rx to give 3 month supply with one refill. Alinda Money repeated correctly, verbalized understanding, appreciation.

## 2018-02-12 ENCOUNTER — Telehealth: Payer: Self-pay | Admitting: *Deleted

## 2018-02-12 NOTE — Telephone Encounter (Signed)
Received PA Approval for [name] Pen from 12/28/2017 thru 12/29/2018.   [employer] Referral # [id number].  ID # [id number].  [phone].
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
## 2018-06-24 ENCOUNTER — Ambulatory Visit: Payer: Medicare HMO | Admitting: Nurse Practitioner

## 2018-06-24 ENCOUNTER — Encounter: Payer: Self-pay | Admitting: Nurse Practitioner

## 2018-06-24 VITALS — BP 156/94 | HR 90 | Ht 75.0 in | Wt 226.0 lb

## 2018-06-24 DIAGNOSIS — G35 Multiple sclerosis: Secondary | ICD-10-CM

## 2018-06-24 DIAGNOSIS — Z5181 Encounter for therapeutic drug level monitoring: Secondary | ICD-10-CM | POA: Diagnosis not present

## 2018-06-24 DIAGNOSIS — R269 Unspecified abnormalities of gait and mobility: Secondary | ICD-10-CM

## 2018-06-24 MED ORDER — BACLOFEN 10 MG PO TABS: ORAL_TABLET | ORAL | 4 refills | Status: DC

## 2018-06-24 MED ORDER — INTERFERON BETA-1A 30 MCG/0.5ML IM AJKT: 30.0000 ug | AUTO-INJECTOR | INTRAMUSCULAR | 6 refills | Status: DC

## 2018-06-24 NOTE — Patient Instructions (Signed)
Continue [name] weekly We will check labs today to monitor adverse effects of [name] Continue to use crutches for safe ambulation Patient has been given information on oral medications however he prefers to stay on [name] at this time Follow-up in 6 months

## 2018-06-25 ENCOUNTER — Telehealth: Payer: Self-pay | Admitting: *Deleted

## 2018-06-25 LAB — CBC WITH DIFFERENTIAL/PLATELET
Basophils Absolute: 0 10*3/uL (ref 0.0–0.2)
Basos: 0 %
EOS (ABSOLUTE): 0.1 10*3/uL (ref 0.0–0.4)
Eos: 3 %
Hematocrit: 43.2 % (ref 37.5–51.0)
Hemoglobin: 15.1 g/dL (ref 13.0–17.7)
Immature Grans (Abs): 0 10*3/uL (ref 0.0–0.1)
Immature Granulocytes: 0 %
Lymphocytes Absolute: 3.3 10*3/uL — ABNORMAL HIGH (ref 0.7–3.1)
Lymphs: 66 %
MCH: 32.1 pg (ref 26.6–33.0)
MCHC: 35 g/dL (ref 31.5–35.7)
MCV: 92 fL (ref 79–97)
Monocytes Absolute: 0.5 10*3/uL (ref 0.1–0.9)
Monocytes: 10 %
Neutrophils Absolute: 1 10*3/uL — ABNORMAL LOW (ref 1.4–7.0)
Neutrophils: 21 %
Platelets: 203 10*3/uL (ref 150–450)
RBC: 4.7 x10E6/uL (ref 4.14–5.80)
RDW: 13.5 % (ref 12.3–15.4)
WBC: 4.9 10*3/uL (ref 3.4–10.8)

## 2018-06-25 LAB — COMPREHENSIVE METABOLIC PANEL
ALT: 34 IU/L (ref 0–44)
AST: 37 IU/L (ref 0–40)
Albumin/Globulin Ratio: 1.6 (ref 1.2–2.2)
Albumin: 4.5 g/dL (ref 3.5–5.5)
Alkaline Phosphatase: 114 IU/L (ref 39–117)
BUN/Creatinine Ratio: 11 (ref 9–20)
BUN: 15 mg/dL (ref 6–24)
Bilirubin Total: 0.4 mg/dL (ref 0.0–1.2)
CO2: 25 mmol/L (ref 20–29)
Calcium: 9.7 mg/dL (ref 8.7–10.2)
Chloride: 105 mmol/L (ref 96–106)
Creatinine, Ser: 1.31 mg/dL — ABNORMAL HIGH (ref 0.76–1.27)
GFR calc Af Amer: 73 mL/min/{1.73_m2} (ref >59)
GFR calc non Af Amer: 63 mL/min/{1.73_m2} (ref >59)
Globulin, Total: 2.9 g/dL (ref 1.5–4.5)
Glucose: 85 mg/dL (ref 65–99)
Potassium: 4.1 mmol/L (ref 3.5–5.2)
Sodium: 142 mmol/L (ref 134–144)
Total Protein: 7.4 g/dL (ref 6.0–8.5)

## 2018-06-25 NOTE — Telephone Encounter (Signed)
-----   Message from Nilda Riggs, NP sent at 06/25/2018  8:09 AM EDT ----- Labs stable please call the patient

## 2018-06-25 NOTE — Telephone Encounter (Signed)
Spoke to pt and relayed that his lab results are stable.  He verbalized understanding.

## 2018-06-29 NOTE — Progress Notes (Signed)
I have reviewed and agreed above plan. 

## 2019-01-04 ENCOUNTER — Telehealth: Payer: Self-pay | Admitting: *Deleted

## 2019-01-04 NOTE — Telephone Encounter (Signed)
[employer] Medicare drug PA line re: [name] PA. Spoke with pharmacist, Gabriel Rung, answered clinical questions. Patient has been stable on [name] since 2009. [name] 30 mcg/ 0.5 mL pen approved from 12/28/18 to 12/30/19.  CMMM key for this PA was: [id number]

## 2019-01-05 ENCOUNTER — Telehealth: Payer: Self-pay | Admitting: *Deleted

## 2019-01-05 MED ORDER — INTERFERON BETA-1A 30 MCG/0.5ML IM AJKT: 30.0000 ug | AUTO-INJECTOR | INTRAMUSCULAR | 6 refills | Status: DC

## 2019-01-05 NOTE — Telephone Encounter (Signed)
Received fax request from [facility] re: refill on [name]. Refilled per previous refill. Patient has a follow up this month.
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
## 2019-01-14 ENCOUNTER — Encounter: Payer: Self-pay | Admitting: Nurse Practitioner

## 2019-01-14 ENCOUNTER — Ambulatory Visit: Payer: Medicare HMO | Admitting: Nurse Practitioner

## 2019-01-14 VITALS — BP 146/95 | HR 86 | Ht 75.0 in | Wt 240.8 lb

## 2019-01-14 DIAGNOSIS — Z5181 Encounter for therapeutic drug level monitoring: Secondary | ICD-10-CM

## 2019-01-14 DIAGNOSIS — G35 Multiple sclerosis: Secondary | ICD-10-CM

## 2019-01-14 DIAGNOSIS — R269 Unspecified abnormalities of gait and mobility: Secondary | ICD-10-CM | POA: Diagnosis not present

## 2019-01-14 NOTE — Patient Instructions (Signed)
Continue [name] weekly will refill We will check labs today to monitor adverse effects of [name] Continue to use crutches for safe ambulation Continue baclofen at current dose Repeat MRI in Oct 2020, next visit Follow-up in 6 months

## 2019-01-15 ENCOUNTER — Telehealth: Payer: Self-pay | Admitting: *Deleted

## 2019-01-15 LAB — CBC WITH DIFFERENTIAL/PLATELET
Basophils Absolute: 0 10*3/uL (ref 0.0–0.2)
Basos: 1 %
EOS (ABSOLUTE): 0.2 10*3/uL (ref 0.0–0.4)
EOS: 4 %
HEMATOCRIT: 43.8 % (ref 37.5–51.0)
Hemoglobin: 15.1 g/dL (ref 13.0–17.7)
IMMATURE GRANULOCYTES: 0 %
Immature Grans (Abs): 0 10*3/uL (ref 0.0–0.1)
Lymphocytes Absolute: 3.7 10*3/uL — ABNORMAL HIGH (ref 0.7–3.1)
Lymphs: 67 %
MCH: 32.4 pg (ref 26.6–33.0)
MCHC: 34.5 g/dL (ref 31.5–35.7)
MCV: 94 fL (ref 79–97)
MONOS ABS: 0.4 10*3/uL (ref 0.1–0.9)
Monocytes: 7 %
NEUTROS ABS: 1.1 10*3/uL — AB (ref 1.4–7.0)
Neutrophils: 21 %
PLATELETS: 199 10*3/uL (ref 150–450)
RBC: 4.66 x10E6/uL (ref 4.14–5.80)
RDW: 12.2 % (ref 11.6–15.4)
WBC: 5.5 10*3/uL (ref 3.4–10.8)

## 2019-01-15 LAB — COMPREHENSIVE METABOLIC PANEL
A/G RATIO: 1.6 (ref 1.2–2.2)
ALBUMIN: 4.6 g/dL (ref 3.5–5.5)
ALT: 33 IU/L (ref 0–44)
AST: 31 IU/L (ref 0–40)
Alkaline Phosphatase: 108 IU/L (ref 39–117)
BUN / CREAT RATIO: 10 (ref 9–20)
BUN: 13 mg/dL (ref 6–24)
Bilirubin Total: 0.4 mg/dL (ref 0.0–1.2)
CALCIUM: 9.3 mg/dL (ref 8.7–10.2)
CO2: 22 mmol/L (ref 20–29)
CREATININE: 1.25 mg/dL (ref 0.76–1.27)
Chloride: 104 mmol/L (ref 96–106)
GFR, EST AFRICAN AMERICAN: 78 mL/min/{1.73_m2} (ref >59)
GFR, EST NON AFRICAN AMERICAN: 67 mL/min/{1.73_m2} (ref >59)
GLOBULIN, TOTAL: 2.8 g/dL (ref 1.5–4.5)
Glucose: 90 mg/dL (ref 65–99)
POTASSIUM: 4.1 mmol/L (ref 3.5–5.2)
SODIUM: 143 mmol/L (ref 134–144)
TOTAL PROTEIN: 7.4 g/dL (ref 6.0–8.5)

## 2019-01-15 NOTE — Progress Notes (Signed)
I have reviewed and agreed above plan. 

## 2019-01-15 NOTE — Telephone Encounter (Signed)
Spoke with patient and informed him his labs are stable. Patient verbalized understanding, appreciation.  

## 2019-07-12 ENCOUNTER — Other Ambulatory Visit: Payer: Self-pay | Admitting: Neurology

## 2019-07-15 ENCOUNTER — Ambulatory Visit: Payer: Medicare HMO | Admitting: Neurology

## 2019-08-04 ENCOUNTER — Other Ambulatory Visit: Payer: Self-pay

## 2019-08-04 ENCOUNTER — Encounter: Payer: Self-pay | Admitting: Neurology

## 2019-08-04 ENCOUNTER — Ambulatory Visit (INDEPENDENT_AMBULATORY_CARE_PROVIDER_SITE_OTHER): Payer: Medicare HMO | Admitting: Neurology

## 2019-08-04 VITALS — BP 122/82 | HR 66 | Ht 75.0 in | Wt 244.0 lb

## 2019-08-04 DIAGNOSIS — G35 Multiple sclerosis: Secondary | ICD-10-CM

## 2019-08-04 DIAGNOSIS — R269 Unspecified abnormalities of gait and mobility: Secondary | ICD-10-CM

## 2019-08-04 MED ORDER — BACLOFEN 10 MG PO TABS: ORAL_TABLET | ORAL | 4 refills | Status: DC
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
## 2019-08-05 ENCOUNTER — Telehealth: Payer: Self-pay | Admitting: Neurology

## 2019-08-05 NOTE — Telephone Encounter (Signed)
[employer] medicare order sent to [facility]. They will obtain the auth and reach out to the patient to schedule.  °

## 2019-09-07 NOTE — Telephone Encounter (Signed)
[employer] Medicare Josem Kaufmann: [id number] (exp. 09/02/19 to 02/29/20) patient is scheduled at [facility] for 09/09/19.

## 2019-09-09 ENCOUNTER — Ambulatory Visit
Admission: RE | Admit: 2019-09-09 | Discharge: 2019-09-09 | Disposition: A | Discharge status: Home / Self Care | Payer: Medicare HMO | Source: Ambulatory Visit | Attending: Neurology | Admitting: Neurology

## 2019-09-09 DIAGNOSIS — G35 Multiple sclerosis: Secondary | ICD-10-CM

## 2019-09-09 DIAGNOSIS — R269 Unspecified abnormalities of gait and mobility: Secondary | ICD-10-CM

## 2019-09-09 MED ORDER — GADOBENATE DIMEGLUMINE 529 MG/ML IV SOLN
20.0000 mL | Freq: Once | INTRAVENOUS | Status: AC | PRN
  Administered 2019-09-09: 20 mL via INTRAVENOUS

## 2019-09-13 ENCOUNTER — Telehealth: Payer: Self-pay | Admitting: Neurology

## 2019-09-13 NOTE — Telephone Encounter (Signed)
Please call patient, MRI scan of the brain and cervical spine showed lesion consistent with chronic demyelinating plaques, no contrast-enhancement, there was no significant change compared to previous scan in September 2016.   IMPRESSION: Abnormal MRI scan of the brain with and without contrast showing multiple periventricular, juxtacortical and pericallosal white matter hyperintensities compatible with chronic demyelinating disease.  No enhancing lesions are noted.  Overall no significant change compared with previous MRI scan.  IMPRESSION: Abnormal MRI scan of cervical spine with and without contrast demonstrating ill-defined spinal cord hyperintensities throughout compatible with chronic demyelinating plaques.  No enhancing lesions are noted.  Overall no significant change compared with previous MRI  films from 09/07/2015

## 2019-09-13 NOTE — Telephone Encounter (Signed)
I spoke to the patient and he verbalized understanding of his MRI results. 

## 2019-09-17 ENCOUNTER — Telehealth: Payer: Self-pay | Admitting: Neurology

## 2019-09-17 MED ORDER — BACLOFEN 10 MG PO TABS: ORAL_TABLET | ORAL | 4 refills | Status: DC

## 2019-09-17 NOTE — Telephone Encounter (Signed)
Pt is needing a refill on his baclofen (LIORESAL) 10 MG tablet sent to the [facility] on [address].

## 2019-09-17 NOTE — Telephone Encounter (Signed)
Refills sent to requested pharmacy. 

## 2019-12-08 ENCOUNTER — Other Ambulatory Visit: Payer: Self-pay | Admitting: *Deleted

## 2020-01-26 ENCOUNTER — Telehealth: Payer: Self-pay | Admitting: Neurology

## 2020-01-26 NOTE — Telephone Encounter (Signed)
The case was never received on covermymeds. I called [employer] at [phone] to complete the PA urgently over the phone. The medication was approved through 12/29/2020. Case [id number].  I also returned the call to [facility] to provide them with this update so there is no disruption with the patient's treatment.

## 2020-01-26 NOTE — Telephone Encounter (Signed)
[email or web address]  Pharmacy has called re: pts's [name] PEN 30 MCG/0.5ML AJKT.Sherice stated they sent the order to Cover My Meds on 01-20-20.  Sherice is asking if RN can call [employer] @ [phone] Policy#[id number] RE: pt states pt is out of his [name] PEN 30 MCG/0.5ML AJKT. Please reply.

## 2020-02-16 ENCOUNTER — Other Ambulatory Visit: Payer: Self-pay

## 2020-02-16 ENCOUNTER — Encounter: Payer: Self-pay | Admitting: Neurology

## 2020-02-16 ENCOUNTER — Ambulatory Visit (INDEPENDENT_AMBULATORY_CARE_PROVIDER_SITE_OTHER): Payer: Medicare HMO | Admitting: Neurology

## 2020-02-16 VITALS — BP 153/90 | HR 97 | Temp 98.3°F | Ht 75.0 in | Wt 244.8 lb

## 2020-02-16 DIAGNOSIS — R269 Unspecified abnormalities of gait and mobility: Secondary | ICD-10-CM | POA: Diagnosis not present

## 2020-02-16 DIAGNOSIS — G35 Multiple sclerosis: Secondary | ICD-10-CM

## 2020-02-16 MED ORDER — AVONEX PEN 30 MCG/0.5ML IM AJKT: 30.0000 ug | AUTO-INJECTOR | INTRAMUSCULAR | 10 refills | Status: DC
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
## 2020-02-16 NOTE — Patient Instructions (Signed)
Continue current medications   Check lab work today   See you back in 6 months

## 2020-02-17 LAB — CBC WITH DIFFERENTIAL/PLATELET
Basophils Absolute: 0 10*3/uL (ref 0.0–0.2)
Basos: 1 %
EOS (ABSOLUTE): 0.3 10*3/uL (ref 0.0–0.4)
Eos: 4 %
Hematocrit: 43.8 % (ref 37.5–51.0)
Hemoglobin: 15.1 g/dL (ref 13.0–17.7)
Immature Grans (Abs): 0 10*3/uL (ref 0.0–0.1)
Immature Granulocytes: 0 %
Lymphocytes Absolute: 4.4 10*3/uL — ABNORMAL HIGH (ref 0.7–3.1)
Lymphs: 62 %
MCH: 31.5 pg (ref 26.6–33.0)
MCHC: 34.5 g/dL (ref 31.5–35.7)
MCV: 91 fL (ref 79–97)
Monocytes Absolute: 0.5 10*3/uL (ref 0.1–0.9)
Monocytes: 8 %
Neutrophils Absolute: 1.7 10*3/uL (ref 1.4–7.0)
Neutrophils: 25 %
Platelets: 221 10*3/uL (ref 150–450)
RBC: 4.8 x10E6/uL (ref 4.14–5.80)
RDW: 12.1 % (ref 11.6–15.4)
WBC: 7 10*3/uL (ref 3.4–10.8)

## 2020-02-17 LAB — COMPREHENSIVE METABOLIC PANEL
ALT: 30 IU/L (ref 0–44)
AST: 45 IU/L — ABNORMAL HIGH (ref 0–40)
Albumin/Globulin Ratio: 1.4 (ref 1.2–2.2)
Albumin: 4.4 g/dL (ref 4.0–5.0)
Alkaline Phosphatase: 120 IU/L — ABNORMAL HIGH (ref 39–117)
BUN/Creatinine Ratio: 10 (ref 9–20)
BUN: 12 mg/dL (ref 6–24)
Bilirubin Total: 0.3 mg/dL (ref 0.0–1.2)
CO2: 22 mmol/L (ref 20–29)
Calcium: 9.5 mg/dL (ref 8.7–10.2)
Chloride: 106 mmol/L (ref 96–106)
Creatinine, Ser: 1.23 mg/dL (ref 0.76–1.27)
GFR calc Af Amer: 79 mL/min/{1.73_m2} (ref >59)
GFR calc non Af Amer: 68 mL/min/{1.73_m2} (ref >59)
Globulin, Total: 3.2 g/dL (ref 1.5–4.5)
Glucose: 84 mg/dL (ref 65–99)
Potassium: 4.4 mmol/L (ref 3.5–5.2)
Sodium: 141 mmol/L (ref 134–144)
Total Protein: 7.6 g/dL (ref 6.0–8.5)

## 2020-02-18 NOTE — Progress Notes (Signed)
I have reviewed and agreed above plan. 

## 2020-02-21 ENCOUNTER — Telehealth: Payer: Self-pay

## 2020-02-21 NOTE — Telephone Encounter (Signed)
Called and spoke with pt directly to go over pts recent labs per NP Lyndle Herrlich request.  Pt had no questions nor concerns.  "Please call the patient. He is taking [name] for MS. CBC is stable, CMP shows very mildly elevated alkaline phosphatase, AST slightly elevated. No change in therapy or treatment plan. See him at next follow-up visit!" NP SS

## 2020-08-07 ENCOUNTER — Ambulatory Visit: Payer: Medicare HMO | Admitting: Neurology

## 2020-08-16 ENCOUNTER — Ambulatory Visit: Payer: Medicare HMO | Admitting: Neurology

## 2020-08-31 ENCOUNTER — Encounter: Payer: Self-pay | Admitting: Neurology

## 2020-08-31 ENCOUNTER — Other Ambulatory Visit: Payer: Self-pay

## 2020-08-31 ENCOUNTER — Ambulatory Visit: Payer: Medicare HMO | Admitting: Neurology

## 2020-08-31 VITALS — BP 147/102 | HR 94 | Ht 75.0 in | Wt 247.6 lb

## 2020-08-31 DIAGNOSIS — G35 Multiple sclerosis: Secondary | ICD-10-CM

## 2020-08-31 MED ORDER — AVONEX PEN 30 MCG/0.5ML IM AJKT: 30.0000 ug | AUTO-INJECTOR | INTRAMUSCULAR | 10 refills | Status: DC

## 2020-08-31 MED ORDER — BACLOFEN 10 MG PO TABS: ORAL_TABLET | ORAL | 4 refills | Status: DC
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
## 2020-08-31 NOTE — Patient Instructions (Signed)
For now continue current medications Given you information on Ocrevus  Please refer to the MS society for information on COVID vaccine See you back in 6 months

## 2020-09-01 LAB — COMPREHENSIVE METABOLIC PANEL
ALT: 29 IU/L (ref 0–44)
AST: 33 IU/L (ref 0–40)
Albumin/Globulin Ratio: 1.3 (ref 1.2–2.2)
Albumin: 4.2 g/dL (ref 3.8–4.9)
Alkaline Phosphatase: 125 IU/L — ABNORMAL HIGH (ref 48–121)
BUN/Creatinine Ratio: 9 (ref 9–20)
BUN: 11 mg/dL (ref 6–24)
Bilirubin Total: 0.4 mg/dL (ref 0.0–1.2)
CO2: 25 mmol/L (ref 20–29)
Calcium: 9.3 mg/dL (ref 8.7–10.2)
Chloride: 102 mmol/L (ref 96–106)
Creatinine, Ser: 1.19 mg/dL (ref 0.76–1.27)
GFR calc Af Amer: 81 mL/min/{1.73_m2} (ref >59)
GFR calc non Af Amer: 70 mL/min/{1.73_m2} (ref >59)
Globulin, Total: 3.2 g/dL (ref 1.5–4.5)
Glucose: 88 mg/dL (ref 65–99)
Potassium: 4 mmol/L (ref 3.5–5.2)
Sodium: 140 mmol/L (ref 134–144)
Total Protein: 7.4 g/dL (ref 6.0–8.5)

## 2020-09-05 ENCOUNTER — Telehealth: Payer: Self-pay

## 2020-09-05 NOTE — Telephone Encounter (Signed)
Shawn Kelly is a 49 y.o. male was contacted and notified of the message below. Pt has no additional questions or concerns.

## 2020-09-05 NOTE — Telephone Encounter (Signed)
-----   Message from Glean Salvo, NP sent at 09/05/2020  6:05 AM EDT ----- No significant abnormality noted. AST is now normal.

## 2020-10-31 NOTE — Progress Notes (Signed)
I have reviewed and agreed above plan. 

## 2021-01-25 ENCOUNTER — Telehealth: Payer: Self-pay | Admitting: Neurology

## 2021-01-25 NOTE — Telephone Encounter (Signed)
Pt request refill Interferon Beta-1a ([name] PEN) 30 MCG/0.5ML AJKT at [facility] - [place], [place]. Pharmacy stated, medication has been rejected. Would like a call from the nurse.

## 2021-01-25 NOTE — Telephone Encounter (Signed)
Received approval for [name] Pen 12-30-20 thru 12-29-21 ID [id number] [employer].  [facility]. Fax confirmation received.

## 2021-01-25 NOTE — Telephone Encounter (Signed)
I called [facility] needs [facility] for avonex pen.  They will need to be call back once initiated on CMM KEY [id number]. Pt has one injection left and gives it on Monday. Will initiale as urgetn request.  Pt informed, called [facility] back t let them know.  (they will f/u on approval).

## 2021-01-29 NOTE — Telephone Encounter (Signed)
Spoke to pt and let him know that avonex approved; till end of this year.  Fax confirmation received to [facility] [phone].

## 2021-02-06 ENCOUNTER — Telehealth: Payer: Self-pay | Admitting: *Deleted

## 2021-02-06 NOTE — Telephone Encounter (Signed)
Approval for [name] on file through 12/29/2021. PA#[id number].  We also received a tier exception request for his [name]. Completed on [email or web address] (key: [id number]). The tier exception was denied. I spoke to a clinical pharmacist at [facility]) who informed me tier exceptions are not available for specialty medications.    [employer] [phone]) [facility] [phone]   I called the patient and notified him of this information.

## 2021-03-01 ENCOUNTER — Encounter: Payer: Self-pay | Admitting: Neurology

## 2021-03-01 ENCOUNTER — Ambulatory Visit: Payer: Medicare HMO | Admitting: Neurology

## 2021-03-01 VITALS — BP 141/85 | HR 107 | Ht 75.0 in | Wt 248.5 lb

## 2021-03-01 DIAGNOSIS — G35 Multiple sclerosis: Secondary | ICD-10-CM | POA: Diagnosis not present

## 2021-03-01 DIAGNOSIS — M62838 Other muscle spasm: Secondary | ICD-10-CM

## 2021-03-01 DIAGNOSIS — R269 Unspecified abnormalities of gait and mobility: Secondary | ICD-10-CM | POA: Diagnosis not present

## 2021-03-01 DIAGNOSIS — G35D Multiple sclerosis, unspecified: Secondary | ICD-10-CM

## 2021-03-01 MED ORDER — BACLOFEN 10 MG PO TABS: 20.0000 mg | ORAL_TABLET | Freq: Three times a day (TID) | ORAL | 4 refills | Status: DC
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
## 2021-03-01 NOTE — Patient Instructions (Addendum)
[email or web address]   Ocrevus (ocrelizumab) iv infusion every 6 months   Dimethyl Fumarate (dimethyl fumarate - generic equivalent of Tecfidera) Gilenya (fingolimod) Tecfidera (dimethyl fumarate)

## 2021-03-02 ENCOUNTER — Telehealth: Payer: Self-pay | Admitting: Neurology

## 2021-03-02 NOTE — Telephone Encounter (Signed)
[employer] medicare order sent to [facility]. They will obtain the auth and reach out to the patient to schedule.  °

## 2021-03-08 ENCOUNTER — Telehealth: Payer: Self-pay | Admitting: Neurology

## 2021-03-08 NOTE — Telephone Encounter (Signed)
Sent order to [facility] to see if they can can accept order for for Wheel chair . [phone] . Thanks Annabelle Harman

## 2021-03-15 NOTE — Telephone Encounter (Signed)
We have not received anything from [facility] concerning an addendum to his clinic notes, prior to today. I returned the call to [facility] to let her know we have provided this information to Dr. Terrace Arabia. The updates were made and his records have been faxed back to the number below.

## 2021-03-15 NOTE — Telephone Encounter (Signed)
Judeth Cornfield from [employer] states they need an addendum added for the wheelchair that pt. isn't able to use a walker, crutches or a cane to sufficiently remit the issue. She states this will be the 3rd fax sent to our office.  Best contact: [phone] ext. [phone]  Fax: [phone]

## 2021-03-19 NOTE — Telephone Encounter (Signed)
Up date Shawn Kelly called from From  [employer] called office  Fax number [phone]  York Spaniel to Addend office note and fax to this number .  I don't have anything on this patient .

## 2021-03-19 NOTE — Telephone Encounter (Signed)
Notes with addendum faxed and confirmed to new fax number below.

## 2021-03-28 ENCOUNTER — Other Ambulatory Visit: Payer: Self-pay

## 2021-03-28 ENCOUNTER — Ambulatory Visit
Admission: RE | Admit: 2021-03-28 | Discharge: 2021-03-28 | Disposition: A | Discharge status: Home / Self Care | Payer: Medicare HMO | Source: Ambulatory Visit | Attending: Neurology | Admitting: Neurology

## 2021-03-28 DIAGNOSIS — R269 Unspecified abnormalities of gait and mobility: Secondary | ICD-10-CM

## 2021-03-28 DIAGNOSIS — G35 Multiple sclerosis: Secondary | ICD-10-CM

## 2021-03-28 MED ORDER — GADOBENATE DIMEGLUMINE 529 MG/ML IV SOLN
20.0000 mL | Freq: Once | INTRAVENOUS | Status: AC | PRN
  Administered 2021-03-28: 20 mL via INTRAVENOUS

## 2021-03-30 ENCOUNTER — Telehealth: Payer: Self-pay | Admitting: Neurology

## 2021-03-30 NOTE — Telephone Encounter (Signed)
I spoke to the patient and provided him with his MRI brain results. He verbalized understanding of the findings.

## 2021-03-30 NOTE — Telephone Encounter (Addendum)
   IMPRESSION:   MRI brain with and without contrast demonstrating: -Multiple supratentorial chronic demyelinating plaques. -No acute plaques.  No new plaques from 09/09/2019.   IMPRESSION:   MRI cervical spine (with and without) demonstrating: - Multiple stable chronic demyelinating plaques at C2, C3, C5 and C7 levels. - No acute plaques.  Please call patient, MRI of the brain and cervical showed multiple chronic demyelinating plaque, no change compared to previous scan in September 2020

## 2021-04-26 ENCOUNTER — Telehealth: Payer: Self-pay | Admitting: Neurology

## 2021-04-26 NOTE — Telephone Encounter (Signed)
Pharmacy Tech Emil @ [facility] is asking for 5 Interferon Beta-1a ([name] PEN) 30 MCG/0.5ML AJKT because the pt received 5 defective ones.

## 2021-04-26 NOTE — Telephone Encounter (Signed)
We received a fax with the request for five replacement [name] pens. It was signed by Dr. Terrace Arabia and faxed back to [facility] at [phone]). Confirmation confirmed.   I also returned the call and spoke to [facility] Scientist, research (physical sciences)) who confirmed the replacement prescription had been received.

## 2021-04-30 ENCOUNTER — Telehealth: Payer: Self-pay | Admitting: Neurology

## 2021-04-30 NOTE — Telephone Encounter (Signed)
The [name] prescription was signed and faxed on 04/26/2021. I have returned the call to [facility] and provided this information to [facility]. I also re-faxed the prescription.

## 2021-04-30 NOTE — Telephone Encounter (Signed)
[facility] Pacer) called, verify date written forInterferon Beta-1a ([name] PEN) 30 MCG/0.5ML AJKT . Prescription was missing the date. Would like a call from the nurse.  Contact Info: [phone], option 2

## 2021-05-01 ENCOUNTER — Other Ambulatory Visit: Payer: Self-pay

## 2021-05-01 MED ORDER — AVONEX PEN 30 MCG/0.5ML IM AJKT: 30.0000 ug | AUTO-INJECTOR | INTRAMUSCULAR | 10 refills | Status: DC

## 2021-09-09 ENCOUNTER — Other Ambulatory Visit: Payer: Self-pay | Admitting: Neurology

## 2021-09-13 ENCOUNTER — Ambulatory Visit: Payer: Medicare HMO | Admitting: Neurology

## 2021-09-13 ENCOUNTER — Other Ambulatory Visit: Payer: Self-pay

## 2021-09-13 ENCOUNTER — Encounter: Payer: Self-pay | Admitting: Neurology

## 2021-09-13 VITALS — BP 127/89 | HR 98 | Ht 75.0 in | Wt 240.0 lb

## 2021-09-13 DIAGNOSIS — G35 Multiple sclerosis: Secondary | ICD-10-CM

## 2021-09-13 DIAGNOSIS — G35D Multiple sclerosis, unspecified: Secondary | ICD-10-CM

## 2021-09-13 DIAGNOSIS — M62838 Other muscle spasm: Secondary | ICD-10-CM

## 2021-09-13 DIAGNOSIS — R269 Unspecified abnormalities of gait and mobility: Secondary | ICD-10-CM | POA: Diagnosis not present

## 2021-09-13 MED ORDER — BACLOFEN 10 MG PO TABS: ORAL_TABLET | ORAL | 0 refills | Status: DC
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
## 2021-09-14 ENCOUNTER — Telehealth: Payer: Self-pay | Admitting: Neurology

## 2021-09-14 LAB — CBC WITH DIFFERENTIAL/PLATELET
Basophils Absolute: 0.1 10*3/uL (ref 0.0–0.2)
Basos: 1 %
EOS (ABSOLUTE): 0.4 10*3/uL (ref 0.0–0.4)
Eos: 5 %
Hematocrit: 38.5 % (ref 37.5–51.0)
Hemoglobin: 12.9 g/dL — ABNORMAL LOW (ref 13.0–17.7)
Immature Grans (Abs): 0 10*3/uL (ref 0.0–0.1)
Immature Granulocytes: 0 %
Lymphocytes Absolute: 4.5 10*3/uL — ABNORMAL HIGH (ref 0.7–3.1)
Lymphs: 58 %
MCH: 31.1 pg (ref 26.6–33.0)
MCHC: 33.5 g/dL (ref 31.5–35.7)
MCV: 93 fL (ref 79–97)
Monocytes Absolute: 0.5 10*3/uL (ref 0.1–0.9)
Monocytes: 7 %
Neutrophils Absolute: 2.2 10*3/uL (ref 1.4–7.0)
Neutrophils: 29 %
Platelets: 279 10*3/uL (ref 150–450)
RBC: 4.15 x10E6/uL (ref 4.14–5.80)
RDW: 12.2 % (ref 11.6–15.4)
WBC: 7.6 10*3/uL (ref 3.4–10.8)

## 2021-09-14 NOTE — Telephone Encounter (Signed)
Please call patient, CBC showed within normal limit WBC, no signs of active infection  Hemoglobin was in the low normal range, 12.9, this is a mild drop compared to previous level of 15.1, I have forwarded the results to his primary care Greer Ee., FNP

## 2021-09-17 NOTE — Telephone Encounter (Signed)
I spoke to the patient. He verbalized understanding of the findings and will follow up with his PCP.

## 2021-10-22 ENCOUNTER — Telehealth: Payer: Self-pay | Admitting: Neurology

## 2021-10-22 NOTE — Telephone Encounter (Signed)
I called patient. He is complaining of back pain. He saw his PCP who prescribed flexeril but his pharmacy would not fill it since he is already on baclofen. He is complaining of weakness. I offered him an appointment tomorrow at 10am but patient declined this appointment since Tuesdays are his "recovery day" from his avonex. I will continue to watch for cancellations on Dr. Terrace Arabia or the NPs schedules.

## 2021-10-22 NOTE — Telephone Encounter (Signed)
Pt is having bad muscle back pain, running fevers, pt is weak and can't sleep.  Pt has accepted Dr Zannie Cove next available appointment and is on wait list.  Pt would like to be called re: anything Dr Terrace Arabia can suggest while pt waits to be seen

## 2021-10-23 ENCOUNTER — Encounter: Payer: Self-pay | Admitting: Neurology

## 2021-10-23 MED ORDER — MELOXICAM 15 MG PO TABS
15.0000 mg | ORAL_TABLET | Freq: Every day | ORAL | 3 refills | Status: DC
Start: 2021-10-23 — End: 2022-01-30

## 2021-10-23 MED ORDER — MELOXICAM 15 MG PO TABS: 15.0000 mg | ORAL_TABLET | Freq: Every day | ORAL | 3 refills | Status: DC

## 2021-10-23 NOTE — Addendum Note (Signed)
Addended by: Levert Feinstein on: 10/23/2021 03:23 PM   Modules accepted: Orders

## 2021-10-23 NOTE — Addendum Note (Signed)
Addended by: Geronimo Running A on: 10/23/2021 03:29 PM   Modules accepted: Orders

## 2021-10-23 NOTE — Telephone Encounter (Signed)
I called patient. I discussed mobic with him. He needs the RX for mobic sent to [facility], not [facility]. He will try this and keep his appt on Thursday. Pt verbalized understanding of recommendations.

## 2021-10-23 NOTE — Telephone Encounter (Signed)
Pt called, can Dr. Terrace Arabia prescribe something for the back pain. Would like a call from the nurse

## 2021-10-23 NOTE — Telephone Encounter (Signed)
Pt has been scheduled for this Thursday with Dr Terrace Arabia at 3pm. He is still requesting medication to help his back pain.

## 2021-10-23 NOTE — Telephone Encounter (Signed)
I spoke with patient yesterday. He is complaining of back pain. He saw his PCP who prescribed flexeril but his pharmacy would not fill it since he is already on baclofen. He is complaining of weakness.  He is on the waitlist for a sooner appointment.  He is requesting medication to help with his back pain. He is already taking baclofen 20mg  TID and 10mg  QHS.

## 2021-10-23 NOTE — Telephone Encounter (Signed)
Meds ordered this encounter  Medications   meloxicam (MOBIC) 15 MG tablet    Sig: Take 1 tablet (15 mg total) by mouth daily.    Dispense:  30 tablet    Refill:  3    After meal as needed for low back pain, may also combine with baclofen, warm compression

## 2021-10-25 ENCOUNTER — Telehealth: Payer: Self-pay | Admitting: Neurology

## 2021-10-25 ENCOUNTER — Other Ambulatory Visit: Payer: Self-pay

## 2021-10-25 ENCOUNTER — Encounter: Payer: Self-pay | Admitting: Neurology

## 2021-10-25 ENCOUNTER — Ambulatory Visit: Payer: Medicare HMO | Admitting: Neurology

## 2021-10-25 VITALS — BP 121/78 | HR 111 | Ht 75.0 in | Wt 235.0 lb

## 2021-10-25 DIAGNOSIS — G35 Multiple sclerosis: Secondary | ICD-10-CM | POA: Diagnosis not present

## 2021-10-25 DIAGNOSIS — M62838 Other muscle spasm: Secondary | ICD-10-CM

## 2021-10-25 DIAGNOSIS — R269 Unspecified abnormalities of gait and mobility: Secondary | ICD-10-CM

## 2021-10-25 MED ORDER — DIAZEPAM 2 MG PO TABS: 2.0000 mg | ORAL_TABLET | Freq: Four times a day (QID) | ORAL | 0 refills | Status: AC | PRN
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
## 2021-10-25 NOTE — Telephone Encounter (Signed)
Dr Terrace Arabia wants pt back in 4 weeks but I can't find anything for then.

## 2021-10-25 NOTE — Telephone Encounter (Signed)
I spoke with Dr. Terrace Arabia. You can schedule for next available with any NP. She will keep an eye out for lab results and we can always bring him in sooner for appt if needed depending on results.

## 2021-10-26 LAB — COMPREHENSIVE METABOLIC PANEL
ALT: 75 IU/L — ABNORMAL HIGH (ref 0–44)
AST: 81 IU/L — ABNORMAL HIGH (ref 0–40)
Albumin/Globulin Ratio: 0.9 — ABNORMAL LOW (ref 1.2–2.2)
Albumin: 3.6 g/dL — ABNORMAL LOW (ref 3.8–4.9)
Alkaline Phosphatase: 129 IU/L — ABNORMAL HIGH (ref 44–121)
BUN/Creatinine Ratio: 14 (ref 9–20)
BUN: 13 mg/dL (ref 6–24)
Bilirubin Total: 0.3 mg/dL (ref 0.0–1.2)
CO2: 21 mmol/L (ref 20–29)
Calcium: 9.6 mg/dL (ref 8.7–10.2)
Chloride: 99 mmol/L (ref 96–106)
Creatinine, Ser: 0.93 mg/dL (ref 0.76–1.27)
Globulin, Total: 3.8 g/dL (ref 1.5–4.5)
Glucose: 92 mg/dL (ref 70–99)
Potassium: 4.4 mmol/L (ref 3.5–5.2)
Sodium: 136 mmol/L (ref 134–144)
Total Protein: 7.4 g/dL (ref 6.0–8.5)
eGFR: 99 mL/min/{1.73_m2} (ref >59)

## 2021-10-26 LAB — CBC WITH DIFFERENTIAL
Basophils Absolute: 0 10*3/uL (ref 0.0–0.2)
Basos: 1 %
EOS (ABSOLUTE): 0.1 10*3/uL (ref 0.0–0.4)
Eos: 2 %
Hematocrit: 35.8 % — ABNORMAL LOW (ref 37.5–51.0)
Hemoglobin: 12.4 g/dL — ABNORMAL LOW (ref 13.0–17.7)
Immature Grans (Abs): 0 10*3/uL (ref 0.0–0.1)
Immature Granulocytes: 0 %
Lymphocytes Absolute: 3.7 10*3/uL — ABNORMAL HIGH (ref 0.7–3.1)
Lymphs: 47 %
MCH: 31.2 pg (ref 26.6–33.0)
MCHC: 34.6 g/dL (ref 31.5–35.7)
MCV: 90 fL (ref 79–97)
Monocytes Absolute: 0.5 10*3/uL (ref 0.1–0.9)
Monocytes: 6 %
Neutrophils Absolute: 3.5 10*3/uL (ref 1.4–7.0)
Neutrophils: 44 %
RBC: 3.97 x10E6/uL — ABNORMAL LOW (ref 4.14–5.80)
RDW: 13.1 % (ref 11.6–15.4)
WBC: 7.9 10*3/uL (ref 3.4–10.8)

## 2021-10-26 LAB — SEDIMENTATION RATE: Sed Rate: 89 mm/hr — ABNORMAL HIGH (ref 0–30)

## 2021-10-26 LAB — TSH: TSH: 0.577 u[IU]/mL (ref 0.450–4.500)

## 2021-10-26 LAB — C-REACTIVE PROTEIN: CRP: 62 mg/L — ABNORMAL HIGH (ref 0–10)

## 2021-10-26 LAB — CK: Total CK: 200 U/L (ref 41–331)

## 2021-10-29 ENCOUNTER — Telehealth: Payer: Self-pay | Admitting: Neurology

## 2021-10-29 NOTE — Telephone Encounter (Signed)
I have called patient,   Laboratory evaluation on October 25, 2021 showed elevated ESR 89, C-reactive protein 62, also abnormal liver functional test, with elevated alkaline phosphate 129, AST 81, ALT 75  CBC showed normal WBC of 7.9 at his baseline, hemoglobin 12.4, mildly decreased compared to a year ago baseline of 15,  Patient reported that he is overall feeling much better, continued Mobic 15 mg daily, which seems to help his symptoms  I have forwarded the lab result to his primary care physician Haywood Filler., FNP , also advised patient to continue follow-up with his PCP for worsening symptoms, pending appointment on November 07, 2021,  He reported few days he had dark brown-colored urine, now has improved, today is clear,  Above findings suggestive of systemic inflammation/infection, I would suggest him continue follow-up with the primary care especially with abnormal liver functional test.

## 2021-11-07 ENCOUNTER — Telehealth: Payer: Self-pay | Admitting: Neurology

## 2021-11-07 NOTE — Telephone Encounter (Signed)
Please check on him who is he doing now, has work up with PCP identify the etiology for his elevated ESR, C-reactive protein, abnormal liver functional test

## 2021-11-07 NOTE — Telephone Encounter (Signed)
I spoke to the patient. He was seen by his PCP today, Richrd Sox, FNP. Collected urine sample and further labs. Waiting on the results. Plan to follow up with them in six months. MRI thoracic scheduled 11/16/21. Meloxicam has been helpful for his discomfort.

## 2021-11-07 NOTE — Telephone Encounter (Signed)
Spoke to pt, states he is at his doctor's appt and will call us back.

## 2021-11-07 NOTE — Progress Notes (Signed)
error 

## 2021-11-08 NOTE — Telephone Encounter (Signed)
UA results received from PCP office.   Results: wnl  Dr. Terrace Arabia has reviewed the results.  Hard copy sent for scanning.

## 2021-11-14 ENCOUNTER — Other Ambulatory Visit: Payer: Medicare HMO

## 2021-11-16 ENCOUNTER — Other Ambulatory Visit: Payer: Self-pay

## 2021-11-16 ENCOUNTER — Ambulatory Visit
Admission: RE | Admit: 2021-11-16 | Discharge: 2021-11-16 | Disposition: A | Discharge status: Home / Self Care | Payer: Medicare HMO | Source: Ambulatory Visit | Attending: Neurology | Admitting: Neurology

## 2021-11-16 DIAGNOSIS — M62838 Other muscle spasm: Secondary | ICD-10-CM

## 2021-11-16 DIAGNOSIS — G35 Multiple sclerosis: Secondary | ICD-10-CM | POA: Diagnosis not present

## 2021-11-16 DIAGNOSIS — R269 Unspecified abnormalities of gait and mobility: Secondary | ICD-10-CM

## 2021-11-16 MED ORDER — GADOBENATE DIMEGLUMINE 529 MG/ML IV SOLN
20.0000 mL | Freq: Once | INTRAVENOUS | Status: AC | PRN
  Administered 2021-11-16: 20 mL via INTRAVENOUS

## 2021-11-19 ENCOUNTER — Telehealth: Payer: Self-pay | Admitting: Neurology

## 2021-11-19 NOTE — Telephone Encounter (Signed)
IMPRESSION: Unremarkable MRI scan of the thoracic spine with and without contrast except ill-defined scattered spinal cord hyperintensity from T2-T10 likely representing remote age demyelinating plaques.  No enhancing lesions are noted.  Please call patient, MRI of the thoracic spine showed ill-defined scattered cord abnormality T2-T10, there was no enhancing lesion to suggest of acute process  Please check on how is she doing now,

## 2021-11-19 NOTE — Telephone Encounter (Signed)
I spoke to the patient. He verbalized understanding of the MRI thoracic findings. Reports his discomfort is under much better control since taking meloxicam 15mg , one tablet daily.

## 2022-01-07 ENCOUNTER — Telehealth: Payer: Self-pay

## 2022-01-07 NOTE — Telephone Encounter (Signed)
Received PA request for avonex from [facility]. Attempted PA. Received this notice from [employer]: "The patient currently has access to the requested medication and a Prior Authorization is not needed for the patient/medication."  Key: [id number]

## 2022-01-17 ENCOUNTER — Ambulatory Visit: Payer: Medicare HMO | Admitting: Neurology

## 2022-01-30 ENCOUNTER — Other Ambulatory Visit: Payer: Self-pay | Admitting: Neurology

## 2022-01-30 ENCOUNTER — Other Ambulatory Visit: Payer: Self-pay

## 2022-01-30 ENCOUNTER — Telehealth: Payer: Self-pay | Admitting: Neurology

## 2022-01-30 MED ORDER — MELOXICAM 15 MG PO TABS: 15.0000 mg | ORAL_TABLET | Freq: Every day | ORAL | 2 refills | Status: DC

## 2022-01-30 NOTE — Telephone Encounter (Signed)
Pt is requesting a refill for meloxicam (MOBIC) 15 MG tablet.  Pharmacy: [facility] [phone]

## 2022-01-30 NOTE — Progress Notes (Signed)
Rx refilled.

## 2022-01-30 NOTE — Telephone Encounter (Signed)
Rx refilled.

## 2022-01-30 NOTE — Telephone Encounter (Signed)
Rx sent to pharmacy for refill

## 2022-03-13 ENCOUNTER — Other Ambulatory Visit: Payer: Self-pay

## 2022-03-13 ENCOUNTER — Encounter: Payer: Self-pay | Admitting: Neurology

## 2022-03-13 ENCOUNTER — Ambulatory Visit: Payer: Medicare HMO | Admitting: Neurology

## 2022-03-13 VITALS — BP 148/92 | HR 97

## 2022-03-13 DIAGNOSIS — G35 Multiple sclerosis: Secondary | ICD-10-CM | POA: Diagnosis not present

## 2022-03-13 DIAGNOSIS — R269 Unspecified abnormalities of gait and mobility: Secondary | ICD-10-CM

## 2022-03-13 MED ORDER — BACLOFEN 10 MG PO TABS: ORAL_TABLET | ORAL | 1 refills | Status: DC

## 2022-03-13 NOTE — Progress Notes (Signed)
Chart reviewed, agree above plan ?

## 2022-03-13 NOTE — Patient Instructions (Signed)
Check labs today  ?When labs result consider medication switch  ?For now continue [name] ?Will update about follow up once labs result ?
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
## 2022-03-14 ENCOUNTER — Telehealth: Payer: Self-pay | Admitting: Neurology

## 2022-03-14 NOTE — Telephone Encounter (Addendum)
I spoke to the patient and reviewed the lab results in detail. No known exposure to hep B. He is agreeable to have repeat labs. He will contact his PCP today to schedule an appt as soon as possible. Right now, he is still under the care of Zachery Dakins, [place]. He will be leaving soon but the patient plans to stay at the same practicue.  ?

## 2022-03-14 NOTE — Addendum Note (Signed)
Addended by: Lindell Spar C on: 03/14/2022 12:32 PM ? ? Modules accepted: Orders ? ?

## 2022-03-14 NOTE — Telephone Encounter (Signed)
Please call the patient laboratory evaluation from yesterday showed: ? ?new anemia 8.2, this is quite a drop from 4 months ago at 12.4.   ?CMP was unremarkable, liver enzymes are normal.   ?His hepatitis B core antibody was positive, this requires more extensive laboratory testing, could indicate current or previous infection. It needs to be repeated for accuracy, and for further information before we decide next steps.  ?CRP has normalized, ESR is improved at 63, was 89 4 months ago ?QuantiFERON is pending ? ?Please ask him to come in today, or first thing next week to have repeat labs collected.  Please forward all results over to PCP, his anemia needs to be addressed rather soon. Once we get more labs back will decide next steps for MS medication planing.  ? ? ?Orders Placed This Encounter  ?Procedures  ? Hepatitis B E Antibody  ? Hepatitis B E Antigen  ? Hepatitis B DNA, Ultraquantitative, PCR  ? Hepatitis B core antibody, IgM  ? Hepatitis B core antibody, total  ? Vitamin B12  ? Iron, TIBC and Ferritin Panel  ? Folate  ? ?

## 2022-03-16 LAB — CBC WITH DIFFERENTIAL/PLATELET
Basophils Absolute: 0.1 10*3/uL (ref 0.0–0.2)
Basos: 1 %
EOS (ABSOLUTE): 0.2 10*3/uL (ref 0.0–0.4)
Eos: 4 %
Hematocrit: 27 % — ABNORMAL LOW (ref 37.5–51.0)
Hemoglobin: 8.2 g/dL — ABNORMAL LOW (ref 13.0–17.7)
Immature Grans (Abs): 0 10*3/uL (ref 0.0–0.1)
Immature Granulocytes: 0 %
Lymphocytes Absolute: 1.9 10*3/uL (ref 0.7–3.1)
Lymphs: 32 %
MCH: 25.4 pg — ABNORMAL LOW (ref 26.6–33.0)
MCHC: 30.4 g/dL — ABNORMAL LOW (ref 31.5–35.7)
MCV: 84 fL (ref 79–97)
Monocytes Absolute: 0.7 10*3/uL (ref 0.1–0.9)
Monocytes: 11 %
Neutrophils Absolute: 3.1 10*3/uL (ref 1.4–7.0)
Neutrophils: 52 %
Platelets: 399 10*3/uL (ref 150–450)
RBC: 3.23 x10E6/uL — ABNORMAL LOW (ref 4.14–5.80)
RDW: 13.8 % (ref 11.6–15.4)
WBC: 5.9 10*3/uL (ref 3.4–10.8)

## 2022-03-16 LAB — HEPATITIS B SURFACE ANTIGEN: Hepatitis B Surface Ag: NEGATIVE

## 2022-03-16 LAB — COMPREHENSIVE METABOLIC PANEL
ALT: 17 IU/L (ref 0–44)
AST: 30 IU/L (ref 0–40)
Albumin/Globulin Ratio: 1.2 (ref 1.2–2.2)
Albumin: 4 g/dL (ref 3.8–4.9)
Alkaline Phosphatase: 120 IU/L (ref 44–121)
BUN/Creatinine Ratio: 12 (ref 9–20)
BUN: 13 mg/dL (ref 6–24)
Bilirubin Total: <0.2 mg/dL (ref 0.0–1.2)
CO2: 23 mmol/L (ref 20–29)
Calcium: 9.2 mg/dL (ref 8.7–10.2)
Chloride: 105 mmol/L (ref 96–106)
Creatinine, Ser: 1.07 mg/dL (ref 0.76–1.27)
Globulin, Total: 3.4 g/dL (ref 1.5–4.5)
Glucose: 91 mg/dL (ref 70–99)
Potassium: 4 mmol/L (ref 3.5–5.2)
Sodium: 140 mmol/L (ref 134–144)
Total Protein: 7.4 g/dL (ref 6.0–8.5)
eGFR: 83 mL/min/{1.73_m2} (ref >59)

## 2022-03-16 LAB — QUANTIFERON-TB GOLD PLUS
QuantiFERON Mitogen Value: >10 IU/mL
QuantiFERON Nil Value: 0.02 IU/mL
QuantiFERON TB1 Ag Value: 0.02 IU/mL
QuantiFERON TB2 Ag Value: 0.02 IU/mL
QuantiFERON-TB Gold Plus: NEGATIVE

## 2022-03-16 LAB — HEPATITIS C ANTIBODY: Hep C Virus Ab: NONREACTIVE

## 2022-03-16 LAB — HEPATITIS B SURFACE ANTIBODY,QUALITATIVE: Hep B Surface Ab, Qual: REACTIVE

## 2022-03-16 LAB — HEPATITIS B CORE ANTIBODY, TOTAL: Hep B Core Total Ab: POSITIVE — AB

## 2022-03-16 LAB — C-REACTIVE PROTEIN: CRP: 9 mg/L (ref 0–10)

## 2022-03-16 LAB — VARICELLA ZOSTER ANTIBODY, IGG: Varicella zoster IgG: 3212 index (ref >165)

## 2022-03-16 LAB — SEDIMENTATION RATE: Sed Rate: 63 mm/hr — ABNORMAL HIGH (ref 0–30)

## 2022-03-17 ENCOUNTER — Other Ambulatory Visit: Payer: Self-pay | Admitting: Neurology

## 2022-03-18 ENCOUNTER — Telehealth: Payer: Self-pay | Admitting: Neurology

## 2022-03-18 NOTE — Telephone Encounter (Signed)
TB Quantiferon was negative, he should be coming this week for further lab testing, regarding Hep B core positive, also drop in HGB to 8.4. I hope he is planning to see his PCP this week ?

## 2022-03-20 ENCOUNTER — Other Ambulatory Visit: Payer: Self-pay | Admitting: Neurology

## 2022-03-20 ENCOUNTER — Telehealth: Payer: Self-pay | Admitting: Neurology

## 2022-03-20 ENCOUNTER — Other Ambulatory Visit (INDEPENDENT_AMBULATORY_CARE_PROVIDER_SITE_OTHER): Payer: Self-pay

## 2022-03-20 DIAGNOSIS — Z0289 Encounter for other administrative examinations: Secondary | ICD-10-CM

## 2022-03-20 DIAGNOSIS — G35 Multiple sclerosis: Secondary | ICD-10-CM

## 2022-03-20 MED ORDER — BACLOFEN 10 MG PO TABS
ORAL_TABLET | ORAL | 1 refills | Status: DC
Start: 2022-03-20 — End: 2022-08-29

## 2022-03-20 NOTE — Telephone Encounter (Signed)
Sarah sent a prescription for baclofen into [facility] today. ?

## 2022-03-20 NOTE — Progress Notes (Signed)
I called [facility] at [phone] and spoke to [place]. He canceled the baclofen prescription. ?

## 2022-03-20 NOTE — Progress Notes (Signed)
Patient needs baclofen sent to [facility] pharmacy, not mail order. He was here for labs today. ? ?Meds ordered this encounter  ?Medications  ? baclofen (LIORESAL) 10 MG tablet  ?  Sig: TAKE 2 TABLETS BY MOUTH THREE TIMES DAILY AND 1 NIGHTLY  ?  Dispense:  630 tablet  ?  Refill:  1  ? ? ?

## 2022-03-21 ENCOUNTER — Telehealth: Payer: Self-pay | Admitting: Neurology

## 2022-03-21 NOTE — Telephone Encounter (Signed)
All of blood work isn't back, but Iron, iron saturation, ferritin level are low, concern for bleeding with drop in HGB to 8.2.  Extensive hepatitis panels are still pending, but repeat Hep B core antibody is still positive.  ? ?I saw him in the lab yesterday, he is yet to contact his PCP.  Please let him know it is very important that he is seen for the anemia.  Please send labs over to PCP, we can send a letter with our concern that he is seen. ? ?Will watch for rest of labs to return.  ?

## 2022-03-21 NOTE — Telephone Encounter (Signed)
I called the patient to discuss the importance of seeing his PCP to address his abnormal lab findings. He still had not called the office. I reached out to this PCP office and completed a conference call. He accepted an appt to see Dr. Silvestre Gunner on 03/22/21. I have faxed his labs over to the provider's attention.  ?

## 2022-03-22 LAB — IRON,TIBC AND FERRITIN PANEL
Ferritin: 15 ng/mL — ABNORMAL LOW (ref 30–400)
Iron Saturation: 6 % — CL (ref 15–55)
Iron: 20 ug/dL — ABNORMAL LOW (ref 38–169)
Total Iron Binding Capacity: 323 ug/dL (ref 250–450)
UIBC: 303 ug/dL (ref 111–343)

## 2022-03-22 LAB — HEPATITIS B DNA, ULTRAQUANTITATIVE, PCR: HBV DNA SERPL PCR-ACNC: NOT DETECTED IU/mL

## 2022-03-22 LAB — HEPATITIS B CORE ANTIBODY, TOTAL: Hep B Core Total Ab: POSITIVE — AB

## 2022-03-22 LAB — HEPATITIS B CORE ANTIBODY, IGM: Hep B C IgM: NEGATIVE

## 2022-03-22 LAB — HEPATITIS B E ANTIGEN: Hep B E Ag: NEGATIVE

## 2022-03-22 LAB — HEPATITIS B E ANTIBODY: Hep B E Ab: POSITIVE — AB

## 2022-03-22 LAB — FOLATE: Folate: 8.4 ng/mL (ref >3.0)

## 2022-03-22 LAB — VITAMIN B12: Vitamin B-12: >2000 pg/mL — ABNORMAL HIGH (ref 232–1245)

## 2022-03-26 ENCOUNTER — Telehealth: Payer: Self-pay | Admitting: Neurology

## 2022-03-26 DIAGNOSIS — G35 Multiple sclerosis: Secondary | ICD-10-CM

## 2022-03-26 NOTE — Telephone Encounter (Signed)
I reviewed labs with Dr. Terrace Arabia. I am going to put in a consult for GI given positive Hep B core, Hep B Antibody and new anemia. I called the patient. He saw his PCP last week is now on oral Iron.  ?

## 2022-03-27 ENCOUNTER — Telehealth: Payer: Self-pay | Admitting: Neurology

## 2022-03-27 NOTE — Telephone Encounter (Signed)
Referral sent to [facility] [phone]. ?

## 2022-03-28 NOTE — Telephone Encounter (Signed)
Shawn Kelly sent the referral yesterday 03/27/22 to Referral sent to [facility] [phone]. ?

## 2022-04-26 ENCOUNTER — Telehealth: Payer: Self-pay | Admitting: Neurology

## 2022-04-26 ENCOUNTER — Other Ambulatory Visit: Payer: Self-pay | Admitting: Neurology

## 2022-04-26 NOTE — Telephone Encounter (Signed)
Pt requesting refill for  meloxicam (MOBIC) 15 MG tablet at [facility] [phone] ?

## 2022-04-29 ENCOUNTER — Telehealth: Payer: Self-pay | Admitting: Neurology

## 2022-04-29 NOTE — Telephone Encounter (Addendum)
Error

## 2022-04-29 NOTE — Telephone Encounter (Signed)
Refill sent.

## 2022-04-29 NOTE — Telephone Encounter (Signed)
Pt has called to check on the status of this refill request ?

## 2022-04-30 ENCOUNTER — Telehealth: Payer: Self-pay | Admitting: Neurology

## 2022-04-30 NOTE — Telephone Encounter (Signed)
I called to check on the patient, is currently taking oral iron, has had initial consult with GI, scheduled for colonoscopy, upper endoscopy in June.  He is to see his PCP next week.  He remains on meloxicam for back spasms, he almost ran out and had significant return.  Not clear that his abnormal hepatitis B labs have been addressed, I will send labs, new over to GI [facility] gastroenterology at [place]. ?

## 2022-04-30 NOTE — Telephone Encounter (Signed)
Request to address positive hep B and copy of lab work faxed to [place]is Pu, PA at gastroenterology. Ph: [phone], Fax: [phone]. ?

## 2022-05-31 ENCOUNTER — Other Ambulatory Visit: Payer: Self-pay | Admitting: Neurology

## 2022-06-03 NOTE — Telephone Encounter (Signed)
Rx refilled.

## 2022-06-23 ENCOUNTER — Other Ambulatory Visit: Payer: Self-pay | Admitting: Neurology

## 2022-07-01 ENCOUNTER — Telehealth: Payer: Self-pay | Admitting: Neurology

## 2022-07-01 MED ORDER — MELOXICAM 15 MG PO TABS: 15.0000 mg | ORAL_TABLET | Freq: Every day | ORAL | 0 refills | Status: DC

## 2022-07-01 NOTE — Telephone Encounter (Signed)
Pt needs a refill on meloxicam (MOBIC) 15 MG tablet . Would like it called in to [facility] [place] on [address].

## 2022-07-01 NOTE — Telephone Encounter (Signed)
Refill sent to the pharmacy 

## 2022-07-21 ENCOUNTER — Other Ambulatory Visit: Payer: Self-pay | Admitting: Neurology

## 2022-08-29 ENCOUNTER — Other Ambulatory Visit: Payer: Self-pay | Admitting: Neurology

## 2022-09-04 ENCOUNTER — Other Ambulatory Visit: Payer: Self-pay | Admitting: Neurology

## 2022-09-19 ENCOUNTER — Telehealth: Payer: Self-pay | Admitting: Neurology

## 2022-09-19 NOTE — Telephone Encounter (Signed)
I called and spoke with the patient to inform him that Shawn Denmark, NP had placed 3 refills on file for his meloxicam.  He wanted to inform the office that he has had a colonoscopy, however it was unclear. He will repeat his colonoscopy tomorrow at [place].

## 2022-09-19 NOTE — Telephone Encounter (Signed)
Pt called needing a refill request for his meloxicam (MOBIC) 15 MG tablet sent in to the [facility] on [address].

## 2022-10-02 ENCOUNTER — Telehealth: Payer: Self-pay | Admitting: Neurology

## 2022-10-02 NOTE — Telephone Encounter (Signed)
Received notes from Dr. Eloy End Arredondo [facility] surgical specialist.  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  Planning for CT staging studies and CEA followed by sigmoid colectomy.   Patient is seen in our office for MS, is on [name].  Last seen by me in March 2023.  Patient is due for a 75-month follow-up, can you schedule please, will route to Dr. Krista Blue.

## 2022-10-08 NOTE — Telephone Encounter (Signed)
LVM asking pt to call back to schedule.

## 2022-11-06 ENCOUNTER — Telehealth: Payer: Self-pay | Admitting: Neurology

## 2022-11-06 NOTE — Telephone Encounter (Signed)
Called pt. Schedule a appointment for 02/06/2023 @ 1:30pm. Pt said thank you and see you in February.

## 2022-11-06 NOTE — Telephone Encounter (Signed)
Pt is calling. Stated he is still dealing with a mass that the cholecystectomy showed. Pt stated he wants [place] to know what's going on with him. Pt said if there are any question please call him.

## 2022-12-18 ENCOUNTER — Other Ambulatory Visit: Payer: Self-pay | Admitting: Neurology

## 2022-12-19 ENCOUNTER — Other Ambulatory Visit: Payer: Self-pay

## 2022-12-19 ENCOUNTER — Telehealth: Payer: Self-pay | Admitting: Neurology

## 2022-12-19 MED ORDER — BACLOFEN 10 MG PO TABS: ORAL_TABLET | ORAL | 0 refills | Status: DC

## 2022-12-19 NOTE — Telephone Encounter (Signed)
Rx sent to pharmacy   

## 2022-12-19 NOTE — Telephone Encounter (Signed)
Pt called stating that he is needing a refill request for his baclofen (LIORESAL) 10 MG tablet sent to the [facility] on [address]

## 2022-12-19 NOTE — Progress Notes (Signed)
Rx sent as per patient request

## 2022-12-31 ENCOUNTER — Telehealth: Payer: Self-pay | Admitting: Neurology

## 2022-12-31 MED ORDER — AVONEX PEN 30 MCG/0.5ML IM AJKT: AUTO-INJECTOR | INTRAMUSCULAR | 2 refills | Status: DC

## 2022-12-31 NOTE — Telephone Encounter (Signed)
Pt called needing a new rx sent in to the [facility] for his Interferon Beta-1a ([name] PEN) 30 MCG/0.5ML AJKT

## 2022-12-31 NOTE — Telephone Encounter (Signed)
Rx sent 

## 2023-01-02 ENCOUNTER — Telehealth: Payer: Self-pay

## 2023-01-02 NOTE — Telephone Encounter (Signed)
PA for [name] sent via cmm.   (Key: [id number])  Your information has been submitted to [place] Medicare Part [place]. [employer] will review the request and will issue a decision, typically within 1-3 days from your submission. You can check the updated outcome later by reopening this request.  If [employer] has not responded in 1-3 days or if you have any questions about your ePA request, please contact [place] Medicare Part D at [phone]. If you think there may be a problem with your PA request, use our live chat feature at the bottom right.

## 2023-01-05 ENCOUNTER — Other Ambulatory Visit: Payer: Self-pay | Admitting: Neurology

## 2023-01-06 NOTE — Telephone Encounter (Signed)
This request has been approved.  Please note any additional information provided by [employer] D at the bottom of your screen.  Approval good from 12/30/22-12/30/2023.

## 2023-01-08 NOTE — Telephone Encounter (Signed)
Pt is calling. Stated he just got off the phone [place] and he was told that they don't have a PA for [name]. Pt said he just want to make sure this is done before he need this medication

## 2023-01-09 ENCOUNTER — Telehealth: Payer: Self-pay | Admitting: Neurology

## 2023-01-09 NOTE — Telephone Encounter (Signed)
[facility] called, PA authorization was approved for the syringe, but need approval for pen for Interferon Beta-1a ([name] PEN) 30 MCG/0.5ML AJK   [phone], Ext [id number]

## 2023-01-09 NOTE — Telephone Encounter (Signed)
   [facility] back and LVM on that extension advising of approval. Left office number in message.

## 2023-01-09 NOTE — Telephone Encounter (Signed)
Minette Headland is calling from [place]. Stated she needs a prescription for [name] syringe. Stated that what was approved by pt insurance and not the Interferon Beta-1a ([name] PEN) 30 MCG/0.5ML AJKT .

## 2023-01-09 NOTE — Telephone Encounter (Signed)
Got it. This is being addressed in another phone note.

## 2023-01-09 NOTE — Telephone Encounter (Signed)
I also faxed new [facility]. Received a receipt of confirmation.

## 2023-01-09 NOTE — Telephone Encounter (Signed)
Called [facility] and spoke with [place]. She was able to confirm the PA approval and will make sure the patient gets a call to schedule shipment of [name].

## 2023-01-09 NOTE — Telephone Encounter (Signed)
PA Case ID #: [id number] Need Help? Call us at [phone] Outcome Approved today Your request has been approved Authorization Expiration Date: 12/30/2023  I [facility] and spoke with a representative and advised of approval. [facility] wasn't quite able to process the claim this soon after approval and stated it might take up to 24 hours. She asked for a call back tomorrow. Our office will be closed so I have asked the patient to call. I will also try to call later this afternoon. He states it is due Monday and he doesn't have any pens. He thanked me for the call. His questions were answered.

## 2023-01-09 NOTE — Telephone Encounter (Signed)
Spoke with [employer]. Another PA has to be completed indicating we are asking for 4 pens (1 kit's worth) per 28 days. The PA was done for 1 mL per 28 days which wouldn't be enough. New KEY: [id number]

## 2023-01-09 NOTE — Telephone Encounter (Signed)
Completed new PA on CMM. Key: [id number]. Awaiting determination from [place].

## 2023-01-09 NOTE — Telephone Encounter (Signed)
Shawn Kelly called from [place] called again and stated that its not the Rx that is needing to be revised, it is the PA that is needing to be revised.

## 2023-01-09 NOTE — Telephone Encounter (Signed)
Called [facility] and spoke with [place]. She was able to confirm the PA approval and will make sure the patient gets a call to schedule shipment of [name].      Pt aware he will get a call from [facility]. He was very Patent attorney.

## 2023-02-02 ENCOUNTER — Other Ambulatory Visit: Payer: Self-pay | Admitting: Neurology

## 2023-02-06 ENCOUNTER — Ambulatory Visit: Payer: Medicare HMO | Admitting: Neurology

## 2023-02-11 ENCOUNTER — Telehealth: Payer: Self-pay | Admitting: Neurology

## 2023-02-11 NOTE — Telephone Encounter (Signed)
I received an office note from Dr. 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  Planning for barium enema in 3 months.  Patient needs follow-up with Dr. Krista Blue for his MS, medication management.

## 2023-02-11 NOTE — Telephone Encounter (Signed)
Pt has been scheduled to see Dr Krista Blue 04-18 , he is aware to check in at 9:30 for a 10:00 appointment

## 2023-02-12 NOTE — Telephone Encounter (Signed)
Called pt. He rescheduled his appointment to 5/13 @ 10 am.  Pt thank me for calling him and rescheduling appointment.

## 2023-02-12 NOTE — Telephone Encounter (Signed)
Pt asked that his 4/18 appointment be cancelled due to it conflicting with another appointment, please call pt

## 2023-03-03 ENCOUNTER — Other Ambulatory Visit: Payer: Self-pay | Admitting: Neurology

## 2023-03-10 ENCOUNTER — Telehealth: Payer: Self-pay | Admitting: Neurology

## 2023-03-10 MED ORDER — BACLOFEN 10 MG PO TABS: ORAL_TABLET | ORAL | 0 refills | Status: DC

## 2023-03-10 MED ORDER — MELOXICAM 15 MG PO TABS: 15.0000 mg | ORAL_TABLET | Freq: Every day | ORAL | 0 refills | Status: DC

## 2023-03-10 NOTE — Telephone Encounter (Signed)
Pt is calling requesting a refill on meloxicam (MOBIC) 15 MG tablet  and baclofen (LIORESAL) 10 MG tablet. Refill should be sent to  [facility]

## 2023-03-10 NOTE — Telephone Encounter (Signed)
Rx refilled.

## 2023-03-10 NOTE — Addendum Note (Signed)
Addended by: Cristela Felt E on: 03/10/2023 01:24 PM   Modules accepted: Orders

## 2023-03-20 ENCOUNTER — Other Ambulatory Visit: Payer: Self-pay | Admitting: Neurology

## 2023-03-27 ENCOUNTER — Other Ambulatory Visit: Payer: Self-pay

## 2023-03-27 MED ORDER — AVONEX PEN 30 MCG/0.5ML IM AJKT: AUTO-INJECTOR | INTRAMUSCULAR | 2 refills | Status: DC

## 2023-04-04 ENCOUNTER — Other Ambulatory Visit: Payer: Self-pay | Admitting: Neurology

## 2023-04-17 ENCOUNTER — Ambulatory Visit: Payer: Medicare HMO | Admitting: Neurology

## 2023-05-07 ENCOUNTER — Other Ambulatory Visit: Payer: Self-pay | Admitting: Neurology

## 2023-05-12 ENCOUNTER — Ambulatory Visit: Payer: Medicare HMO | Admitting: Neurology

## 2023-05-12 ENCOUNTER — Encounter: Payer: Self-pay | Admitting: Neurology

## 2023-05-12 VITALS — BP 178/119 | HR 108 | Ht 75.0 in | Wt 235.0 lb

## 2023-05-12 DIAGNOSIS — G35 Multiple sclerosis: Secondary | ICD-10-CM

## 2023-05-12 DIAGNOSIS — C187 Malignant neoplasm of sigmoid colon: Secondary | ICD-10-CM | POA: Diagnosis not present

## 2023-05-12 MED ORDER — BACLOFEN 10 MG PO TABS: 10.0000 mg | ORAL_TABLET | Freq: Three times a day (TID) | ORAL | 3 refills | Status: DC

## 2023-05-12 MED ORDER — DULOXETINE HCL 60 MG PO CPEP
60.0000 mg | ORAL_CAPSULE | Freq: Every day | ORAL | 11 refills | Status: DC
Start: 2023-05-12 — End: 2024-02-12
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
## 2023-05-22 ENCOUNTER — Other Ambulatory Visit: Payer: Self-pay | Admitting: Neurology

## 2023-06-02 ENCOUNTER — Other Ambulatory Visit: Payer: Self-pay | Admitting: Neurology

## 2023-06-10 ENCOUNTER — Other Ambulatory Visit: Payer: Self-pay | Admitting: Neurology

## 2023-06-23 ENCOUNTER — Telehealth: Payer: Self-pay | Admitting: Neurology

## 2023-06-23 MED ORDER — BACLOFEN 10 MG PO TABS: ORAL_TABLET | ORAL | 0 refills | Status: DC

## 2023-06-23 NOTE — Telephone Encounter (Signed)
Refill sent.

## 2023-06-23 NOTE — Telephone Encounter (Signed)
Pt requesting a refill on baclofen (LIORESAL) 10 MG tablet. Refill should be sent to [facility] [phone]

## 2023-07-27 ENCOUNTER — Other Ambulatory Visit: Payer: Self-pay | Admitting: Neurology

## 2023-08-07 ENCOUNTER — Other Ambulatory Visit: Payer: Self-pay | Admitting: Neurology

## 2023-09-02 ENCOUNTER — Other Ambulatory Visit: Payer: Self-pay | Admitting: Neurology

## 2023-10-08 ENCOUNTER — Other Ambulatory Visit: Payer: Self-pay | Admitting: Neurology

## 2023-12-17 ENCOUNTER — Telehealth: Payer: Self-pay | Admitting: Neurology

## 2023-12-17 MED ORDER — AVONEX PEN 30 MCG/0.5ML IM AJKT: AUTO-INJECTOR | INTRAMUSCULAR | 2 refills | Status: DC

## 2023-12-17 NOTE — Telephone Encounter (Signed)
Pt is requesting a refill for [name] PEN 30 MCG/0.5ML AJKT.  Pharmacy: [facility]

## 2023-12-17 NOTE — Telephone Encounter (Signed)
Rx sent to [facility]

## 2024-01-28 ENCOUNTER — Other Ambulatory Visit: Payer: Self-pay | Admitting: Neurology

## 2024-02-06 ENCOUNTER — Telehealth: Payer: Self-pay | Admitting: Pharmacy Technician

## 2024-02-06 ENCOUNTER — Other Ambulatory Visit (HOSPITAL_COMMUNITY): Payer: Self-pay

## 2024-02-06 NOTE — Telephone Encounter (Signed)
 Pharmacy Patient Advocate Encounter   Received notification from CoverMyMeds that prior authorization for [name]  Prefilled 30MCG/0.5ML syringe kit is required/requested.   Insurance verification completed.   The patient is insured through [facility] .   Per test claim: PA required; PA submitted to above mentioned insurance via CoverMyMeds Key/confirmation #/EOC [id number] Status is pending

## 2024-02-06 NOTE — Telephone Encounter (Signed)
 Pharmacy Patient Advocate Encounter  Received notification from [facility] that Prior Authorization for [name]  Prefilled 30MCG/0.5ML syringe kit has been APPROVED from 02/06/2024 to 12/29/2024   PA #/Case ID/Reference #: PA Case ID #: [id number]

## 2024-02-11 ENCOUNTER — Telehealth: Payer: Self-pay | Admitting: Neurology

## 2024-02-11 NOTE — Telephone Encounter (Signed)
Pt called to verify appointment
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
## 2024-02-12 ENCOUNTER — Encounter: Payer: Self-pay | Admitting: Neurology

## 2024-02-12 ENCOUNTER — Ambulatory Visit: Payer: Medicare HMO | Admitting: Neurology

## 2024-02-12 VITALS — BP 214/128 | HR 110 | Ht 75.0 in | Wt 235.0 lb

## 2024-02-12 DIAGNOSIS — G35 Multiple sclerosis: Secondary | ICD-10-CM | POA: Diagnosis not present

## 2024-02-12 DIAGNOSIS — R269 Unspecified abnormalities of gait and mobility: Secondary | ICD-10-CM | POA: Diagnosis not present

## 2024-02-12 MED ORDER — BACLOFEN 10 MG PO TABS: ORAL_TABLET | ORAL | 1 refills | Status: DC

## 2024-02-12 MED ORDER — DULOXETINE HCL 60 MG PO CPEP: 60.0000 mg | ORAL_CAPSULE | Freq: Every day | ORAL | 3 refills | Status: DC

## 2024-02-12 NOTE — Patient Instructions (Addendum)
Continue the [name] Check labs today  Reduce baclofen to 4 times daily 10 mg Continue cymbalta Close monitor BP, follow up with primary care  Follow up in 6 months

## 2024-02-13 LAB — CBC WITH DIFFERENTIAL/PLATELET
Basophils Absolute: 0 10*3/uL (ref 0.0–0.2)
Basos: 1 %
EOS (ABSOLUTE): 0.4 10*3/uL (ref 0.0–0.4)
Eos: 7 %
Hematocrit: 41.9 % (ref 37.5–51.0)
Hemoglobin: 14.6 g/dL (ref 13.0–17.7)
Immature Grans (Abs): 0 10*3/uL (ref 0.0–0.1)
Immature Granulocytes: 1 %
Lymphocytes Absolute: 2.8 10*3/uL (ref 0.7–3.1)
Lymphs: 51 %
MCH: 34 pg — ABNORMAL HIGH (ref 26.6–33.0)
MCHC: 34.8 g/dL (ref 31.5–35.7)
MCV: 98 fL — ABNORMAL HIGH (ref 79–97)
Monocytes Absolute: 0.6 10*3/uL (ref 0.1–0.9)
Monocytes: 10 %
Neutrophils Absolute: 1.6 10*3/uL (ref 1.4–7.0)
Neutrophils: 30 %
Platelets: 175 10*3/uL (ref 150–450)
RBC: 4.29 x10E6/uL (ref 4.14–5.80)
RDW: 12.4 % (ref 11.6–15.4)
WBC: 5.4 10*3/uL (ref 3.4–10.8)

## 2024-02-13 LAB — COMPREHENSIVE METABOLIC PANEL
ALT: 19 [IU]/L (ref 0–44)
AST: 31 [IU]/L (ref 0–40)
Albumin: 3.8 g/dL (ref 3.8–4.9)
Alkaline Phosphatase: 120 [IU]/L (ref 44–121)
BUN/Creatinine Ratio: 11 (ref 9–20)
BUN: 12 mg/dL (ref 6–24)
Bilirubin Total: 0.4 mg/dL (ref 0.0–1.2)
CO2: 23 mmol/L (ref 20–29)
Calcium: 9.2 mg/dL (ref 8.7–10.2)
Chloride: 103 mmol/L (ref 96–106)
Creatinine, Ser: 1.14 mg/dL (ref 0.76–1.27)
Globulin, Total: 3.8 g/dL (ref 1.5–4.5)
Glucose: 116 mg/dL — ABNORMAL HIGH (ref 70–99)
Potassium: 3.8 mmol/L (ref 3.5–5.2)
Sodium: 141 mmol/L (ref 134–144)
Total Protein: 7.6 g/dL (ref 6.0–8.5)
eGFR: 76 mL/min/{1.73_m2} (ref >59)

## 2024-02-17 ENCOUNTER — Telehealth: Payer: Self-pay

## 2024-02-17 NOTE — Telephone Encounter (Signed)
 Call to patient, reviewed results. He verbalized understanding. Following up with PCP next month

## 2024-02-17 NOTE — Telephone Encounter (Signed)
-----   Message from Glean Salvo sent at 02/17/2024  9:07 AM EST ----- Please call, blood work looks overall good, no significant abnormalities.  Continue with current treatment plan. I talked with Dr. Terrace Arabia, will continue on [name], can consider next visit, MRI imaging for MS stability, hopefully BP under better control, may evaluate other treatment options. Please call for any worsening MS symptoms. Thanks

## 2024-02-20 NOTE — Progress Notes (Signed)
 Chart reviewed, agree above plan ?

## 2024-02-26 ENCOUNTER — Other Ambulatory Visit: Payer: Self-pay | Admitting: Neurology

## 2024-02-28 ENCOUNTER — Other Ambulatory Visit: Payer: Self-pay | Admitting: Neurology

## 2024-03-09 ENCOUNTER — Telehealth: Payer: Self-pay | Admitting: Neurology

## 2024-03-09 NOTE — Telephone Encounter (Signed)
 Encounter on 02-06-2024 has an approved prior authorization effective til 12-29-24

## 2024-03-09 NOTE — Telephone Encounter (Signed)
 Pt has only 1 shot of medication left, please do PA for pt's [name] 58mcg/0.5ml.

## 2024-03-09 NOTE — Telephone Encounter (Signed)
 Called pharmacy and they stated that pt's PA for the Auto Injector has expired and needs to be updated.

## 2024-03-09 NOTE — Telephone Encounter (Signed)
 Pt states the specialty pharmacy has informed him that a PA is needed for his Interferon Beta-1a ([name] PEN) 30 MCG/0.5ML AJKT .  Pt asked it be noted that he has 1 shot left.

## 2024-03-10 ENCOUNTER — Telehealth: Payer: Self-pay

## 2024-03-10 NOTE — Telephone Encounter (Signed)
[name] approved

## 2024-03-10 NOTE — Telephone Encounter (Signed)
 PA request has been Started. New Encounter has been or will be created for follow up. For additional info see Pharmacy Prior Auth telephone encounter from 03/10/2024.

## 2024-03-10 NOTE — Telephone Encounter (Signed)
 We were messaged yesterday (03-09-24) about needing a PA. The information about approval was relayed to Ms Shawn Kelly but she states that it was expired, hence the new PA request. Please advise.

## 2024-03-10 NOTE — Telephone Encounter (Signed)
 Pharmacy Patient Advocate Encounter   Received notification from Pt Calls Messages that prior authorization for [name] Pen 30MCG/0.5ML auto-injector kit is required/requested.   Insurance verification completed.   The patient is insured through [facility] [employer] .   KEY BELDCRDE  Prior Authorization form/request asks a question that requires your assistance. Please see the question below and advise accordingly. The PA will not be submitted until the necessary information is received.

## 2024-03-15 ENCOUNTER — Telehealth: Payer: Self-pay | Admitting: Neurology

## 2024-03-15 NOTE — Telephone Encounter (Signed)
 Pt called stating that he was informed that his Interferon Beta-1a ([name] PEN) 30 MCG/0.5ML AJKT is needing a PA. Please advise. Pt is out.

## 2024-03-16 ENCOUNTER — Telehealth: Payer: Self-pay

## 2024-03-16 ENCOUNTER — Other Ambulatory Visit (HOSPITAL_COMMUNITY): Payer: Self-pay

## 2024-03-16 NOTE — Telephone Encounter (Signed)
 PA request has been Submitted. New Encounter has been or will be created for follow up. For additional info see Pharmacy Prior Auth telephone encounter from 03/16/2024.

## 2024-03-16 NOTE — Telephone Encounter (Signed)
 Pharmacy Patient Advocate Encounter  Received notification from [facility] that Prior Authorization for [name] Pen 30MCG/0.5ML auto-injector kit  has been APPROVED from 03/16/2024 to 12/29/2024. Ran test claim, Copay is $0. This test claim was processed through [facility]- copay amounts may vary at other pharmacies due to pharmacy/plan contracts, or as the patient moves through the different stages of their insurance plan.   PA #/Case ID/Reference #: PA Case ID #: [id number]

## 2024-03-16 NOTE — Telephone Encounter (Signed)
 Pharmacy Patient Advocate Encounter   Received notification from Physician's Office that prior authorization for [name] Pen 30MCG/0.5ML auto-injector kit is required/requested.   Insurance verification completed.   The patient is insured through [facility] .   Per test claim: PA required; PA submitted to above mentioned insurance via CoverMyMeds Key/confirmation #/EOC [facility] Status is pending

## 2024-07-23 ENCOUNTER — Other Ambulatory Visit: Payer: Self-pay | Admitting: Neurology

## 2024-07-27 ENCOUNTER — Telehealth: Payer: Self-pay

## 2024-07-27 NOTE — Telephone Encounter (Signed)
 Letter faxed to the [employer] foundation

## 2024-09-02 ENCOUNTER — Ambulatory Visit: Payer: Medicare HMO | Admitting: Neurology

## 2024-09-02 ENCOUNTER — Encounter: Payer: Self-pay | Admitting: Neurology

## 2024-09-02 VITALS — BP 142/80 | HR 118

## 2024-09-02 DIAGNOSIS — R269 Unspecified abnormalities of gait and mobility: Secondary | ICD-10-CM | POA: Diagnosis not present

## 2024-09-02 DIAGNOSIS — G35 Multiple sclerosis: Secondary | ICD-10-CM

## 2024-09-02 DIAGNOSIS — M62838 Other muscle spasm: Secondary | ICD-10-CM

## 2024-09-02 MED ORDER — BACLOFEN 10 MG PO TABS: ORAL_TABLET | ORAL | 1 refills | Status: DC
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
## 2024-09-02 NOTE — Patient Instructions (Signed)
 We will continue current medications.  Continue to try to be as active as possible.  Monitor blood pressure at home.  Continue close follow-up with primary care.  Please call for any new or worsening symptoms.  Follow-up in 6 months with Dr. Onita. Thanks

## 2024-12-22 ENCOUNTER — Other Ambulatory Visit: Payer: Self-pay | Admitting: Neurology

## 2024-12-27 NOTE — Telephone Encounter (Signed)
 Last OV 09/02/24 Next OV 03/17/25  Last refill 09/02/24 Qty #270/1   Renal labs 12/24/24 - abnormal, forwarding to provider.   tains abnormal data Comprehensive Metabolic Panel Order: [id number] Component Ref Range & Units 3 d ago  Sodium 136 - 145 mmol/L 132 Low   Potassium 3.4 - 4.5 mmol/L 4.5  Chloride 98 - 107 mmol/L 97 Low   CO2 21 - 31 mmol/L 23  Comment: High lactate dehydrogenase (LDH) concentrations in patient samples may cause falsely increased bicarbonate results. If markedly elevated LDH levels are suspected, please assess results in conjunction with patient's LDH values.  Anion Gap 6 - 14 mmol/L 12  Glucose, Random 70 - 99 mg/dL 70  Blood Urea Nitrogen (BUN) 7 - 25 mg/dL 28 High   Creatinine 9.29 - 1.30 mg/dL 8.29 High   eGFR >40 fO/fpw/8.26f7 47 Low   Comment: GFR estimated by CKD-EPI equations(NKF 2021).  Recommend confirmation of Cr-based eGFR by using Cys-based eGFR and other filtration markers (if applicable) in complex cases and clinical decision-making, as needed.  Albumin 3.5 - 5.7 g/dL 3.7  Total Protein 6.4 - 8.9 g/dL 8.0  Bilirubin, Total 0.3 - 1.0 mg/dL 0.8  Alkaline Phosphatase (ALP) 34 - 104 U/L 83  Aspartate Aminotransferase (AST) 13 - 39 U/L 49 High   Alanine Aminotransferase (ALT) 7 - 52 U/L 51  Calcium 8.6 - 10.3 mg/dL 9.7  BUN/Creatinine Ratio 10.0 - 20.0 16.5

## 2024-12-29 ENCOUNTER — Telehealth: Payer: Self-pay

## 2024-12-29 ENCOUNTER — Other Ambulatory Visit (HOSPITAL_COMMUNITY): Payer: Self-pay

## 2024-12-29 NOTE — Telephone Encounter (Signed)
 Pharmacy Patient Advocate Encounter   Received notification from Fax that prior authorization for [name]  is required/requested.   Insurance verification completed.   The patient is insured through [facility].   Per test claim: PA required; PA submitted to above mentioned insurance via Latent Key/confirmation #/EOC [id number] Status is pending

## 2025-01-03 NOTE — Telephone Encounter (Signed)
 Pharmacy Patient Advocate Encounter  Received notification from [facility] that Prior Authorization for [name]  has been APPROVED from 12/29/2024 to 12/29/2025   PA #/Case ID/Reference #: [id number]

## 2025-01-29 ENCOUNTER — Other Ambulatory Visit: Payer: Self-pay | Admitting: Neurology

## 2025-03-17 ENCOUNTER — Ambulatory Visit: Admitting: Neurology
